# Patient Record
Sex: Female | Born: 1965
Health system: Southern US, Community
[De-identification: ages and names within clinical notes are randomized; demographics above are authoritative.]

## PROBLEM LIST (undated history)

## (undated) DIAGNOSIS — F411 Generalized anxiety disorder: Secondary | ICD-10-CM

## (undated) DIAGNOSIS — E042 Nontoxic multinodular goiter: Secondary | ICD-10-CM

## (undated) DIAGNOSIS — K5792 Diverticulitis of intestine, part unspecified, without perforation or abscess without bleeding: Secondary | ICD-10-CM

## (undated) DIAGNOSIS — E0590 Thyrotoxicosis, unspecified without thyrotoxic crisis or storm: Secondary | ICD-10-CM

## (undated) DIAGNOSIS — C4492 Squamous cell carcinoma of skin, unspecified: Secondary | ICD-10-CM

## (undated) DIAGNOSIS — J302 Other seasonal allergic rhinitis: Secondary | ICD-10-CM

## (undated) DIAGNOSIS — F410 Panic disorder [episodic paroxysmal anxiety] without agoraphobia: Secondary | ICD-10-CM

## (undated) DIAGNOSIS — D271 Benign neoplasm of left ovary: Secondary | ICD-10-CM

## (undated) DIAGNOSIS — R079 Chest pain, unspecified: Secondary | ICD-10-CM

## (undated) DIAGNOSIS — J329 Chronic sinusitis, unspecified: Secondary | ICD-10-CM

## (undated) HISTORY — DX: Thyrotoxicosis, unspecified without thyrotoxic crisis or storm: E05.90

## (undated) HISTORY — PX: AUGMENTATION MAMMAPLASTY: SUR837

## (undated) HISTORY — DX: Generalized anxiety disorder: F41.1

## (undated) HISTORY — DX: Diverticulitis of intestine, part unspecified, without perforation or abscess without bleeding: K57.92

## (undated) HISTORY — DX: Other seasonal allergic rhinitis: J30.2

## (undated) HISTORY — DX: Chronic sinusitis, unspecified: J32.9

## (undated) HISTORY — PX: BREAST EXCISIONAL BIOPSY: SUR124

## (undated) HISTORY — PX: OTHER SURGICAL HISTORY: SHX169

## (undated) HISTORY — DX: Nontoxic multinodular goiter: E04.2

## (undated) HISTORY — DX: Squamous cell carcinoma of skin, unspecified: C44.92

## (undated) HISTORY — DX: Benign neoplasm of left ovary: D27.1

## (undated) HISTORY — DX: Chest pain, unspecified: R07.9

## (undated) HISTORY — PX: TONSILLECTOMY: SUR1361

## (undated) HISTORY — PX: ABDOMINAL HYSTERECTOMY: SHX81

---

## 1986-05-18 HISTORY — PX: BREAST ENHANCEMENT SURGERY: SHX7

## 1994-05-18 HISTORY — PX: BREAST BIOPSY: SHX20

## 1995-05-19 DIAGNOSIS — R079 Chest pain, unspecified: Secondary | ICD-10-CM

## 1995-05-19 HISTORY — PX: OTHER SURGICAL HISTORY: SHX169

## 1995-05-19 HISTORY — DX: Chest pain, unspecified: R07.9

## 1997-01-16 ENCOUNTER — Encounter: Payer: Self-pay | Admitting: Family Medicine

## 1997-01-16 LAB — CONVERTED CEMR LAB
Blood Glucose, Fasting: 90 mg/dL
WBC, blood: 6.5 10*3/uL

## 1998-06-18 HISTORY — PX: OTHER SURGICAL HISTORY: SHX169

## 2004-03-20 ENCOUNTER — Ambulatory Visit: Payer: Self-pay | Admitting: Family Medicine

## 2004-03-28 ENCOUNTER — Emergency Department: Payer: Self-pay | Admitting: Emergency Medicine

## 2005-02-15 ENCOUNTER — Encounter: Payer: Self-pay | Admitting: Family Medicine

## 2005-02-15 LAB — CONVERTED CEMR LAB: Pap Smear: NORMAL

## 2005-04-20 ENCOUNTER — Ambulatory Visit: Payer: Self-pay | Admitting: Family Medicine

## 2005-05-18 DIAGNOSIS — E0590 Thyrotoxicosis, unspecified without thyrotoxic crisis or storm: Secondary | ICD-10-CM

## 2005-05-18 HISTORY — PX: OTHER SURGICAL HISTORY: SHX169

## 2005-05-18 HISTORY — DX: Thyrotoxicosis, unspecified without thyrotoxic crisis or storm: E05.90

## 2005-07-08 ENCOUNTER — Ambulatory Visit: Payer: Self-pay | Admitting: Family Medicine

## 2005-10-22 ENCOUNTER — Ambulatory Visit: Payer: Self-pay | Admitting: Family Medicine

## 2005-10-23 ENCOUNTER — Encounter: Payer: Self-pay | Admitting: Family Medicine

## 2005-10-23 LAB — CONVERTED CEMR LAB: WBC, blood: 6.8 10*3/uL

## 2005-10-29 ENCOUNTER — Ambulatory Visit: Payer: Self-pay | Admitting: Family Medicine

## 2007-03-29 ENCOUNTER — Ambulatory Visit: Payer: Self-pay | Admitting: Family Medicine

## 2007-06-14 ENCOUNTER — Encounter: Payer: Self-pay | Admitting: Family Medicine

## 2007-08-23 ENCOUNTER — Ambulatory Visit: Payer: Self-pay | Admitting: Family Medicine

## 2007-09-01 ENCOUNTER — Encounter: Payer: Self-pay | Admitting: Family Medicine

## 2007-09-01 DIAGNOSIS — R079 Chest pain, unspecified: Secondary | ICD-10-CM | POA: Insufficient documentation

## 2007-09-01 DIAGNOSIS — F411 Generalized anxiety disorder: Secondary | ICD-10-CM | POA: Insufficient documentation

## 2008-03-18 HISTORY — PX: CT ABD W & PELVIS WO CM: HXRAD294

## 2008-03-29 ENCOUNTER — Telehealth: Payer: Self-pay | Admitting: Family Medicine

## 2008-03-29 ENCOUNTER — Ambulatory Visit: Payer: Self-pay | Admitting: Cardiology

## 2008-03-29 ENCOUNTER — Ambulatory Visit: Payer: Self-pay | Admitting: Family Medicine

## 2008-03-29 DIAGNOSIS — K5732 Diverticulitis of large intestine without perforation or abscess without bleeding: Secondary | ICD-10-CM | POA: Insufficient documentation

## 2008-03-29 LAB — CONVERTED CEMR LAB
ALT: 20 units/L (ref 0–35)
Bacteria, UA: 0
Bilirubin Urine: NEGATIVE
CO2: 28 meq/L (ref 19–32)
Chloride: 105 meq/L (ref 96–112)
Eosinophils Absolute: 0 10*3/uL (ref 0.0–0.7)
Eosinophils Relative: 0.2 % (ref 0.0–5.0)
GFR calc non Af Amer: 98 mL/min
Lymphocytes Relative: 8.7 % — ABNORMAL LOW (ref 12.0–46.0)
Monocytes Absolute: 0.8 10*3/uL (ref 0.1–1.0)
Neutrophils Relative %: 83.4 % — ABNORMAL HIGH (ref 43.0–77.0)
Nitrite: NEGATIVE
Platelets: 202 10*3/uL (ref 150–400)
RDW: 12.1 % (ref 11.5–14.6)
Specific Gravity, Urine: <1.005
Urobilinogen, UA: 0.2
WBC: 11 10*3/uL — ABNORMAL HIGH (ref 4.5–10.5)
pH: 6.5

## 2008-04-02 ENCOUNTER — Ambulatory Visit: Payer: Self-pay | Admitting: Family Medicine

## 2008-04-03 ENCOUNTER — Telehealth: Payer: Self-pay | Admitting: Family Medicine

## 2008-04-09 ENCOUNTER — Ambulatory Visit: Payer: Self-pay | Admitting: Family Medicine

## 2008-05-08 ENCOUNTER — Encounter: Payer: Self-pay | Admitting: Family Medicine

## 2009-12-05 ENCOUNTER — Ambulatory Visit: Payer: Self-pay | Admitting: Family Medicine

## 2009-12-05 DIAGNOSIS — IMO0001 Reserved for inherently not codable concepts without codable children: Secondary | ICD-10-CM | POA: Insufficient documentation

## 2009-12-24 ENCOUNTER — Encounter (INDEPENDENT_AMBULATORY_CARE_PROVIDER_SITE_OTHER): Payer: Self-pay | Admitting: *Deleted

## 2010-01-23 ENCOUNTER — Ambulatory Visit: Payer: Self-pay

## 2010-05-23 ENCOUNTER — Ambulatory Visit
Admission: RE | Admit: 2010-05-23 | Discharge: 2010-05-23 | Payer: Self-pay | Source: Home / Self Care | Attending: Family Medicine | Admitting: Family Medicine

## 2010-06-19 NOTE — Assessment & Plan Note (Signed)
Summary: CHEST CONGESTION, PRESSURE IN EARS, COUGH / LFW   Vital Signs:  Patient profile:   45 year old female Weight:      157.75 pounds Temp:     98.1 degrees F oral Pulse rate:   80 / minute Pulse rhythm:   regular BP sitting:   130 / 80  (left arm) Cuff size:   regular  Vitals Entered By: Selena Batten Dance CMA Duncan Dull) (May 23, 2010 3:46 PM) CC: Chest congestion/cough/ears stopped up Comments Given Z-pack, cough medicine and mucinex D on Monday-no help at all   History of Present Illness: CC: chest congestion  2 1/2 wk h/o chest congestion, ear pressure, sinus pressure.  Started as sinus pressure, now seems concentrated in ears and chest.  Still some sinus pressure.  Given Z-pack, on day 4 of course, using guaifenesin and steam along with ibuprofen.  Doesnt feel helping.  + nausea.  iniitally dripping down throat, now better.  cough keeping up at night.  No fevers/chills, abd pain, v/d, rashes, myalgias, arthralgias  no smokers at home.  No h/o asthma.  + h/o allergies.  h/o artificial tear ducts bilaterally.  h/o recurrent sinus infections in past.  severe cough leading to urinary accidents, very embarrassing for her, has had to leave work early this week 2/2 this.  Current Medications (verified): 1)  Aleve 220 Mg Tabs (Naproxen Sodium) .... As Needed 2)  Tylenol 325 Mg Tabs (Acetaminophen) .... As Needed  Allergies: 1)  ! Celebrex  Past History:  Social History: Last updated: 09/01/2007 Marital Status: Married  LIVES WITH HUSBAND Children: 2 CHILDREN Occupation: [employer]  Past Medical History: allergies, seasonal recurrent sinusitis  Review of Systems       per HPI  Physical Exam  General:  Well-developed,well-nourished,in no acute distress; alert,appropriate and cooperative throughout examination.  congested, + cough.  nontoxic Head:  NCAT, + sinus tenderness maxillary Eyes:  PERRLA, EOMI, slight injection Ears:  clear  bilaterally, some fluid behind TMs Nose:  nares clear bilaterally Mouth:  MMM no pharyngeal erythema Neck:  No deformities, masses, or tenderness noted.  no LAD Lungs:  Normal respiratory effort, chest expands symmetrically. Lungs are clear to auscultation, no crackles or wheezes. Heart:  Normal rate and regular rhythm. S1 and S2 normal without gallop, murmur, click, rub or other extra sounds. Pulses:  2+ rad pulses, brisk cap refill Extremities:  no pedal edema Psych:  breaks down when describing stress incontinence sxs with severe cough   Impression & Recommendations:  Problem # 1:  COUGH (ICD-786.2) likely 2/2 sinusitis.  zpack may not be fully covering.  treat with amoxicillin twice daily for 10 days.  tussionex at night and tessalon perls during day to suppress cough.  update early next week if no better, consider broadening to levo or augmentin.   may use nasal saline, neti pot or small amt decongestant.  Complete Medication List: 1)  Aleve 220 Mg Tabs (Naproxen sodium) .... As needed 2)  Tylenol 325 Mg Tabs (Acetaminophen) .... As needed 3)  Amoxicillin 875 Mg Tabs (Amoxicillin) .... Twice daily for 10 days 4)  Tussionex Pennkinetic Er 10-8 Mg/55ml Lqcr (Hydrocod polst-chlorphen polst) .... One by mouth two times a day as needed cough, mainly night 5)  Tessalon Perles 100 Mg Caps (Benzonatate) .... One by mouth three times a day as needed cough  Patient Instructions: 1)  Take medicines as prescribed: amoxicillin and tussionex for cough at night 2)  Take guaifenesin  400mg  IR 1 1/2 pills in am and at noon with plenty of fluid to help mobilize mucous.  tessalon perls during day for cough 3)  Use nasal saline spray or neti pot to help drainage of sinuses. 4)  Push fluids. 5)  If you start having fevers >101.5, trouble swallowing or breathing, or are worsening instead of improving as expected, you may need to be seen again. 6)  Good to see you today, call clinic with questions.    Prescriptions: TESSALON PERLES 100 MG CAPS (BENZONATATE) one by mouth three times a day as needed cough  #30 x 0   Entered and Authorized by:   Eustaquio Boyden  MD   Signed by:   Eustaquio Boyden  MD on 05/23/2010   Method used:   Electronically to        [facility]* (retail)       [address]       [place], [place]  [address]       Ph: [phone]       Fax: [phone]   RxID:   [id number] Sandria Senter ER 10-8 MG/5ML LQCR (HYDROCOD POLST-CHLORPHEN POLST) one by mouth two times a day as needed cough, mainly night  #100cc x 0   Entered and Authorized by:   Eustaquio Boyden  MD   Signed by:   Eustaquio Boyden  MD on 05/23/2010   Method used:   Print then Give to Patient   RxID:   [id number] AMOXICILLIN 875 MG TABS (AMOXICILLIN) twice daily for 10 days  #20 x 0   Entered and Authorized by:   Eustaquio Boyden  MD   Signed by:   Eustaquio Boyden  MD on 05/23/2010   Method used:   Electronically to        [facility]* (retail)       [address]       [place], [place]  [address]       Ph: [phone]       Fax: [phone]   RxID:   [id number]    Orders Added: 1)  Est. Patient Level III [32[address]]    Current Allergies (reviewed today): ! CELEBREX

## 2010-06-19 NOTE — Assessment & Plan Note (Signed)
Summary: NECK,SHOULDER PAIN,NAUSEA/CLE   Vital Signs:  Patient profile:   45 year old female Height:      67 inches Weight:      125.75 pounds BMI:     19.77 Temp:     98.2 degrees F oral Pulse rate:   92 / minute Pulse rhythm:   regular BP sitting:   114 / 84  (right arm) Cuff size:   regular  Vitals Entered By: Karen Rodriguez CMA Karen Rodriguez) (December 05, 2009 3:30 PM) CC: Neck, shoulder pain, some nausea from the pain.  Was on a float being pulled by a boat on Saturday and was slung around some.   History of Present Illness: Tubing this past weekend behind jetski.  No pain Monday or Tuesday, but "I didn't feel like walking with my neighbor like usual on Monday".  Pain yesterday from bra strap up.  Worse today and also with L ear pain.  Back pain is midline and radiates, more to the L than R.  Pain with ROM at neck.  Pain with elevating arms.  No LOC on tube.  No FCV.  Nausea from pain.  Taking aleve and tylenol.  Some help but pain not relieved with meds.  No tingling or loss of sensation in hands, feet.  No bowel/bladder dysfxn.  No other trauma.  Safe at home.   Allergies: 1)  ! Zithromax 2)  ! Celebrex  Review of Systems       See HPI.  Otherwise noncontributory.    Physical Exam  General:  GEN: nad, alert and oriented HEENT: mucous membranes moist, tm wnl bilateral, PERRL, EOMI NECK: not stiff but sore with range of motion. diffusely tender to palpation on posterior aspect.  CV: rrr.  no murmur PULM: ctab, no inc wob ABD: soft, +bs EXT: no edema SKIN: no acute rash, no bruising noted DTRs wnl, strength equal bilaterally, sensation wnl.   Back: upper back with midline pain and diffusely tender to palpation, L>R.     Impression & Recommendations:  Problem # 1:  MUSCLE PAIN (ICD-729.1) Likely related to tubing.  Sedation caution.  Refer to physical therapy if not better.  I see no indication for imaging.  there is no reason to suspect an acute bony process.  I think this is  diffuse soft tissue irritation and muscle spasm.  The following medications were removed from the medication list:    Ibuprofen 200 Mg Caps (Ibuprofen) .Marland Kitchen... As needed pain    Vicodin 5-500 Mg Tabs (Hydrocodone-acetaminophen) ..... One tab by mouth every 6 hrs as needed for pain Her updated medication list for this problem includes:    Aleve 220 Mg Tabs (Naproxen sodium) .Marland Kitchen... As needed    Tylenol 325 Mg Tabs (Acetaminophen) .Marland Kitchen... As needed    Vicodin 5-500 Mg Tabs (Hydrocodone-acetaminophen) .Marland Kitchen... 1-2 tabs by mouth three times a day as needed pain    Flexeril 10 Mg Tabs (Cyclobenzaprine hcl) .Marland Kitchen... 1/2-1 tab by mouth three times a day as needed pain.  Complete Medication List: 1)  Aleve 220 Mg Tabs (Naproxen sodium) .... As needed 2)  Tylenol 325 Mg Tabs (Acetaminophen) .... As needed 3)  Vicodin 5-500 Mg Tabs (Hydrocodone-acetaminophen) .Marland Kitchen.. 1-2 tabs by mouth three times a day as needed pain 4)  Flexeril 10 Mg Tabs (Cyclobenzaprine hcl) .... 1/2-1 tab by mouth three times a day as needed pain.  Patient Instructions: 1)  Take the meds as needed.  Both can make you drowsy.  You can take  a 1/2 pill of either to get started.  Don't take extra tylenol with the vicodin. 2)  Please schedule a follow-up appointment as needed- call back if not better so we can set up physical therapy.  3)  Recommend staying out of work as long as you need potentially sedating pain meds.   Prescriptions: FLEXERIL 10 MG TABS (CYCLOBENZAPRINE HCL) 1/2-1 tab by mouth three times a day as needed pain.  #30 x 0   Entered and Authorized by:   Crawford Givens MD   Signed by:   Crawford Givens MD on 12/05/2009   Method used:   Print then Give to Patient   RxID:   [id number] VICODIN 5-500 MG TABS (HYDROCODONE-ACETAMINOPHEN) 1-2 tabs by mouth three times a day as needed pain  #30 x 0   Entered and Authorized by:   Crawford Givens MD   Signed by:   Crawford Givens MD on 12/05/2009   Method used:   Print then Give to Patient    RxID:   [id number]   Current Allergies (reviewed today): ! ZITHROMAX ! CELEBREX

## 2010-06-19 NOTE — Letter (Signed)
Summary: Karen Rodriguez letter  [facility]  [address] [place], [place] [address]   Phone: [phone]  Fax: [phone]       12/24/2009 MRN: [id number]  [facility] [address] [place], [place]  [address]  Dear Ms. [facility] [place] and [place] announce the retirement of Arta Silence, M.D., from full-time practice at the [facility] office effective November 14, 2009 and his plans of returning part-time.  It is important to Dr. Hetty Ely and to our practice that you understand that [facility] has seven physicians in our office for your health care needs.  We will continue to offer the same exceptional care that you have today.    Dr. Hetty Ely has spoken to many of you about his plans for retirement and returning part-time in the fall.   We will continue to work with you through the transition to schedule appointments for you in the office and meet the high standards that [facility] is committed to.   Again, it is with great pleasure that we share the news that Dr. Hetty Ely will return to [facility] in October of 2011 with a reduced schedule.    If you have any questions, or would like to request an appointment with one of our physicians, please call us at [phone] and press the option for Scheduling an appointment.  We take pleasure in providing you with excellent patient care and look forward to seeing you at your next office visit.  Our [facility] Physicians are:  Tillman Abide, M.D. Laurita Quint, M.D. Roxy Manns, M.D. Kerby Nora, M.D. Hannah Beat, M.D. Ruthe Mannan, M.D. We proudly welcomed Raechel Ache, M.D. and Eustaquio Boyden, M.D. to the practice in July/August 2011.  Sincerely,  [facility]

## 2010-09-16 HISTORY — PX: CT ABD W & PELVIS WO CM: HXRAD294

## 2010-10-15 ENCOUNTER — Telehealth: Payer: Self-pay | Admitting: *Deleted

## 2010-10-15 ENCOUNTER — Ambulatory Visit (INDEPENDENT_AMBULATORY_CARE_PROVIDER_SITE_OTHER): Payer: BC Managed Care – PPO | Admitting: Family Medicine

## 2010-10-15 ENCOUNTER — Encounter: Payer: Self-pay | Admitting: Family Medicine

## 2010-10-15 ENCOUNTER — Encounter: Payer: Self-pay | Admitting: *Deleted

## 2010-10-15 ENCOUNTER — Ambulatory Visit (INDEPENDENT_AMBULATORY_CARE_PROVIDER_SITE_OTHER)
Admission: RE | Admit: 2010-10-15 | Discharge: 2010-10-15 | Disposition: A | Discharge status: Home / Self Care | Payer: BC Managed Care – PPO | Source: Ambulatory Visit | Attending: Internal Medicine | Admitting: Internal Medicine

## 2010-10-15 VITALS — BP 136/80 | HR 84 | Temp 98.3°F | Ht 68.5 in | Wt 152.0 lb

## 2010-10-15 DIAGNOSIS — R1032 Left lower quadrant pain: Secondary | ICD-10-CM

## 2010-10-15 LAB — CBC WITH DIFFERENTIAL/PLATELET
Basophils Absolute: 0.1 10*3/uL (ref 0.0–0.1)
Eosinophils Absolute: 0.1 10*3/uL (ref 0.0–0.7)
HCT: 39.8 % (ref 36.0–46.0)
Hemoglobin: 14 g/dL (ref 12.0–15.0)
Lymphocytes Relative: 13.6 % (ref 12.0–46.0)
Lymphs Abs: 1.4 10*3/uL (ref 0.7–4.0)
MCHC: 35.3 g/dL (ref 30.0–36.0)
Monocytes Relative: 6.4 % (ref 3.0–12.0)
Neutro Abs: 8 10*3/uL — ABNORMAL HIGH (ref 1.4–7.7)
Platelets: 225 10*3/uL (ref 150.0–400.0)
RDW: 13.4 % (ref 11.5–14.6)

## 2010-10-15 LAB — COMPREHENSIVE METABOLIC PANEL
ALT: 16 U/L (ref 0–35)
AST: 18 U/L (ref 0–37)
CO2: 23 mEq/L (ref 19–32)
Calcium: 9.1 mg/dL (ref 8.4–10.5)
Chloride: 105 mEq/L (ref 96–112)
GFR: 94.64 mL/min (ref >60.00)
Sodium: 136 mEq/L (ref 135–145)
Total Protein: 6.8 g/dL (ref 6.0–8.3)

## 2010-10-15 MED ORDER — HYDROCODONE-ACETAMINOPHEN 5-325 MG PO TABS: 1.0000 | ORAL_TABLET | Freq: Four times a day (QID) | ORAL | Status: AC | PRN

## 2010-10-15 MED ORDER — METRONIDAZOLE 500 MG PO TABS: 500.0000 mg | ORAL_TABLET | Freq: Three times a day (TID) | ORAL | Status: AC

## 2010-10-15 MED ORDER — IOHEXOL 300 MG/ML  SOLN
80.0000 mL | Freq: Once | INTRAMUSCULAR | Status: AC | PRN
  Administered 2010-10-15: 80 mL via INTRAVENOUS

## 2010-10-15 MED ORDER — CIPROFLOXACIN HCL 500 MG PO TABS: 500.0000 mg | ORAL_TABLET | Freq: Two times a day (BID) | ORAL | Status: AC

## 2010-10-15 NOTE — Telephone Encounter (Signed)
Please call in vicodin script then notify patient.

## 2010-10-15 NOTE — Telephone Encounter (Signed)
Pt states she just got off the phone with you. She told you she didn't want any pain medicine but she has decided that she does, so that she can sleep.  Uses [facility].

## 2010-10-15 NOTE — Patient Instructions (Signed)
Clear liquid diet starting today. Blood work today. We will set you up with CT scan for today - pass by [facility]'s office. We will obtain results of CT and then likely start antibiotics. Return next week for follow up, sooner if not improving as expected. If any worsening, please go to ER.

## 2010-10-15 NOTE — Progress Notes (Signed)
  Subjective:    Patient ID: Karen Rodriguez, female    DOB: [birth year], 45 y.o.   MRN: [id number]  HPI CC: abd pain  44yo with h/o diverticulitis presents with:  2 d h/o LLQ pain, severe.  Increased BMs but not necessarily diarrhea.  Painful to walk, to change positions.  Constant pain.  Even pain to void.  Flatus more malodorous than usual.  Started metamucil, eating healthier and more fruits/vegetables, fiber, but nauseated after each meal.  Alleve hasn't really helped pain.  Hunching over helps pain but not resolved pain.  + decreased appetite.  No dysuria.  No fevers/chills.  No blood in stool.  No vomiting.    Did have colonoscopy 2009, no records available.  H/o diverticulitis, first and only episode previously 2009 dx by CT scan.  No current RLQ pain.  No h/o abd surgeries.  Medications and allergies reviewed and updated in chart. Pmhx, surghx reviewed and updated in chart  Review of Systems Per HPI    Objective:   Physical Exam  Nursing note and vitals reviewed. Constitutional: She appears well-developed and well-nourished. She appears distressed (in tears from pain).  HENT:  Head: Normocephalic and atraumatic.  Mouth/Throat: Oropharynx is clear and moist. No oropharyngeal exudate.  Eyes: Conjunctivae and EOM are normal. Pupils are equal, round, and reactive to light. No scleral icterus.  Cardiovascular: Normal rate, regular rhythm, normal heart sounds and intact distal pulses.   No murmur heard. Pulmonary/Chest: Effort normal and breath sounds normal. No respiratory distress. She has no wheezes. She has no rales.  Abdominal: Soft. Bowel sounds are normal. She exhibits no distension and no mass. There is tenderness (mainly LLQ tenderness). There is guarding. There is no rebound.       Neg psoas sign. Some voluntary guarding with palpation, tears with palpation at LLQ.  Neurological: She is alert.  Skin: Skin is warm and dry. No rash noted.  Psychiatric: She has a normal  mood and affect.          Assessment & Plan:

## 2010-10-15 NOTE — Assessment & Plan Note (Addendum)
2-3 days duration in setting of known diverticulitis 2009.  Anticipate diverticulitis flare, however significant pain on exam, no fever. Pt states had colonoscopy 2009, told normal.  No records in chart of colonoscpy. rec clear liquid diet, will check blood work stat today and set up with CT of abdomen to r/o other cause of abd pain given severity of pain on exam today. If diverticulitis, treat with abx and close f/u.   Return in 5 days for f/u, sooner if needed. Advised if worsening, or fever, to go to ER for eval. Will have call report done to my cell this afternoon.

## 2010-10-15 NOTE — Telephone Encounter (Signed)
Rx called in as directed and patient notified.  

## 2010-10-21 ENCOUNTER — Encounter: Payer: Self-pay | Admitting: Family Medicine

## 2010-10-21 ENCOUNTER — Ambulatory Visit: Payer: BC Managed Care – PPO | Admitting: Family Medicine

## 2010-10-21 ENCOUNTER — Telehealth: Payer: Self-pay | Admitting: Family Medicine

## 2010-10-21 NOTE — Telephone Encounter (Signed)
Can we call with update on sxs? Can we get copy of colonoscopy from Dr. Servando Snare 2009 [facility]? Thanks.

## 2010-10-22 NOTE — Telephone Encounter (Signed)
Message left advising patient to call with an update.

## 2010-10-22 NOTE — Telephone Encounter (Signed)
Received records from GI - same as in chart.  Never had colonoscopy.  Will need to be set up.

## 2010-10-22 NOTE — Telephone Encounter (Signed)
Records requested

## 2010-10-26 ENCOUNTER — Encounter: Payer: Self-pay | Admitting: Family Medicine

## 2010-10-27 NOTE — Telephone Encounter (Signed)
Pt did not come in for f/u appt.  Please advise she will need to return to GI for further evaluation and likely colonoscopy.  We can set up if she desires.

## 2010-10-27 NOTE — Telephone Encounter (Signed)
Message left notifying patient to follow up with GI for eval and possible colonoscopy. Advised to call with any questions or if she needed [place] to make appt for her.

## 2010-10-27 NOTE — Telephone Encounter (Signed)
Were you going to set this up for her or was that something she needed to do?

## 2011-05-18 ENCOUNTER — Ambulatory Visit: Payer: Self-pay

## 2011-10-20 ENCOUNTER — Ambulatory Visit: Payer: Self-pay | Admitting: Internal Medicine

## 2012-04-08 ENCOUNTER — Encounter: Payer: Self-pay | Admitting: Family Medicine

## 2012-04-08 ENCOUNTER — Ambulatory Visit (INDEPENDENT_AMBULATORY_CARE_PROVIDER_SITE_OTHER): Payer: BC Managed Care – PPO | Admitting: Family Medicine

## 2012-04-08 VITALS — BP 122/74 | HR 78 | Temp 98.0°F | Wt 154.0 lb

## 2012-04-08 DIAGNOSIS — IMO0002 Reserved for concepts with insufficient information to code with codable children: Secondary | ICD-10-CM

## 2012-04-08 DIAGNOSIS — M5410 Radiculopathy, site unspecified: Secondary | ICD-10-CM

## 2012-04-08 MED ORDER — GABAPENTIN 300 MG PO CAPS: 300.0000 mg | ORAL_CAPSULE | Freq: Three times a day (TID) | ORAL | Status: DC

## 2012-04-08 MED ORDER — HYDROCODONE-ACETAMINOPHEN 5-325 MG PO TABS: 1.0000 | ORAL_TABLET | Freq: Four times a day (QID) | ORAL | Status: DC | PRN

## 2012-04-08 NOTE — Patient Instructions (Addendum)
Take 1 gabapentin today, gradually increase to 3 a day if needed. vicodin for pain in meantime, sedation caution.  Take care.  Follow up with Dr. Reece Agar if not improved.

## 2012-04-08 NOTE — Progress Notes (Signed)
Clarified allergies- she does tolerate aleve.    L arm pain.  She went to the gym.  She did a kettle bell class and an ab class this week.  Now with radicular pain from the L shoulder down the L arm and into the dorsum L 3rd and 4th fingers.     No weakness but pain with rom at the wrist and fingers.   No other injury.  No rash. No R sided or any leg sx.    Meds, vitals, and allergies reviewed.   ROS: See HPI.  Otherwise, noncontributory.  nad Uncomfortable Neck with normal ROM, not ttp in midlin L paraspinal muscles in lower C spine tight, tender No rash Normal ROM at the shoulder No dec in sensation or DTRs in the BUE Pain with resisted ROM at the L wrist, hand, fingers Distally NV intact o/w

## 2012-04-09 DIAGNOSIS — M5410 Radiculopathy, site unspecified: Secondary | ICD-10-CM | POA: Insufficient documentation

## 2012-04-09 NOTE — Assessment & Plan Note (Signed)
In ~c8 distribution.  No need to image now.  With spasm.  Use vicodin and increasing gabapentin dose for pain. .she agrees.  D/w pt about anatomy and she understood.  Relative rest in general, would use pendulum exercises for the shoulder.

## 2012-11-15 HISTORY — PX: FINE NEEDLE ASPIRATION: SHX406

## 2013-01-04 ENCOUNTER — Ambulatory Visit (INDEPENDENT_AMBULATORY_CARE_PROVIDER_SITE_OTHER): Payer: BC Managed Care – PPO | Admitting: Family Medicine

## 2013-01-04 ENCOUNTER — Encounter: Payer: Self-pay | Admitting: Family Medicine

## 2013-01-04 VITALS — BP 124/76 | HR 64 | Temp 98.2°F | Wt 155.0 lb

## 2013-01-04 DIAGNOSIS — M7711 Lateral epicondylitis, right elbow: Secondary | ICD-10-CM | POA: Insufficient documentation

## 2013-01-04 DIAGNOSIS — E042 Nontoxic multinodular goiter: Secondary | ICD-10-CM

## 2013-01-04 DIAGNOSIS — E049 Nontoxic goiter, unspecified: Secondary | ICD-10-CM | POA: Insufficient documentation

## 2013-01-04 DIAGNOSIS — M7710 Lateral epicondylitis, unspecified elbow: Secondary | ICD-10-CM

## 2013-01-04 LAB — TSH: TSH: 0.32 u[IU]/mL — ABNORMAL LOW (ref 0.35–5.50)

## 2013-01-04 LAB — T3, FREE: T3, Free: 2.9 pg/mL (ref 2.3–4.2)

## 2013-01-04 MED ORDER — NAPROXEN 500 MG PO TABS: ORAL_TABLET | ORAL | Status: DC

## 2013-01-04 NOTE — Patient Instructions (Signed)
Sign release of records from Dr. Jenne Campus Blood work today to check thyroid. Do stretching exercises. May use naporsyn twice daily with food for 5 days then as needed. Buy tennis elbow brace. If not improving, let us know for referral to Dr. Patsy Lager

## 2013-01-04 NOTE — Progress Notes (Signed)
  Subjective:    Patient ID: Karen Rodriguez, female    DOB: 21-Feb-1966, 47 y.o.   MRN: [id number]  HPI CC: check R elbow  R elbow pain since April 2014.  Started after rowing in pond.  Improved with time but persistent pain especially with heavy lifting.  Pain localized posterior elbow.  Persistent pain.  Taking aleve which helps.  Also used OTC cream. Denies inciting injury/trauma.  Woke up with swelling in neck over summer.  Seen by ENT - s/p FNA told dried blood.  Resolved on its own.  Saw Dr. Jenne Campus. Gaining weight, staying hot, would like thyroid checked.  No fmhx thyroid trouble. H/o thyroid nodules - saw Dr. Patrecia Pace who did biopsy and told normal. Lab Results  Component Value Date   TSH 0.22 10/23/2005    Past Medical History  Diagnosis Date  . Seasonal allergies   . Recurrent sinusitis   . Other specified personal history presenting hazards to health(V15.89)   . Anxiety state, unspecified   . Chest pain, unspecified   . Diverticulitis of colon (without mention of hemorrhage)     dx by CT x 2 (2009, 2012).  never had colonoscopy done.  . Thyrotoxicosis without mention of goiter or other cause, without mention of thyrotoxic crisis or storm     Subclinical MNG  . Enlargement of lymph nodes   . Myalgia and myositis, unspecified     Past Surgical History  Procedure Laterality Date  . Tonsillectomy    . Breast enhancement surgery  1988    Dr. Genevieve Norlander; then revised in [facility] ([place])  . Breast biopsy      Left-benign  . Tear duct surgery  06/1998    Right  . Nsvd      x2  . Cardiolite  1997    normal, negative echo (Dr. Greggory Keen  . Thyroid US      benign cyst - R lobe 3mm, 1.1cm mid post left lobe, 1.2 cm  mid left lobe  . Ct abd w & pelvis wo cm  03/2008    splenomegaly, sigmoid diverticulitis, 2cm dermoid L ovary, constipation  . Ct abd w & pelvis wo cm  09/2010    acute focal uncomplicated upper sigmoid diverticulosis, dermoid cyst L ovary, constipation    Review  of Systems Per HPI    Objective:   Physical Exam  Nursing note and vitals reviewed. Constitutional: She appears well-developed and well-nourished. No distress.  Neck: Neck supple. No thyromegaly present.  Musculoskeletal: She exhibits no edema.  FROM flexion/extension bilateral elbows.  No pain with palpation of elbow landmarks on left elbow. Mildly tender to palpation of R lateral epicondyle but no tenderness to palpation medial epicondyle or olecranon. Tender with forced pronation. Tender with testing forearm flexors       Assessment & Plan:

## 2013-01-04 NOTE — Assessment & Plan Note (Signed)
See pt instructions for plan. Treated with stretching/strengthening exercises from [facility] pt advisor Naprosyn prescription provided today.

## 2013-01-04 NOTE — Assessment & Plan Note (Signed)
Check TFTs today. H/o MNG - consider thyroid US to follow h/o nodules.  (h/o benign biopsy in past). I have requested records from Dr. Jenne Campus to update chart.

## 2013-01-08 ENCOUNTER — Encounter: Payer: Self-pay | Admitting: Family Medicine

## 2013-01-08 ENCOUNTER — Telehealth: Payer: Self-pay | Admitting: Family Medicine

## 2013-01-08 NOTE — Telephone Encounter (Signed)
plz notify I received records from Dr. Jenne Campus and recommend she keep appt with him in November, nothing else needed now. At her next physical I will recommend we obtain thyroid ultrasound to follow h/o thyroid nodules.

## 2013-01-09 NOTE — Telephone Encounter (Signed)
Left message on voice mail  to call back

## 2013-01-11 NOTE — Telephone Encounter (Signed)
Message left notifying patient.

## 2014-05-07 ENCOUNTER — Ambulatory Visit: Payer: Self-pay

## 2014-06-20 ENCOUNTER — Ambulatory Visit (INDEPENDENT_AMBULATORY_CARE_PROVIDER_SITE_OTHER): Payer: BLUE CROSS/BLUE SHIELD | Admitting: General Surgery

## 2014-06-20 ENCOUNTER — Other Ambulatory Visit: Payer: BLUE CROSS/BLUE SHIELD

## 2014-06-20 ENCOUNTER — Encounter: Payer: Self-pay | Admitting: General Surgery

## 2014-06-20 VITALS — BP 118/78 | HR 80 | Resp 14 | Ht 68.5 in | Wt 159.0 lb

## 2014-06-20 DIAGNOSIS — N6001 Solitary cyst of right breast: Secondary | ICD-10-CM

## 2014-06-20 NOTE — Progress Notes (Signed)
Patient ID: Karen Rodriguez, female   DOB: Aug 28, 1965, 49 y.o.   MRN: [id number]  Chief Complaint  Patient presents with  . Other    evaluation of right breast mass- possible cyst    HPI AARICA Rodriguez is a 49 y.o. female who presents for an evaluation of a right breast mass. She states she noticed it in December at the same time she noted bilateral breast tenderness and irritation of the nipple areolar complex. Her most recent mammogram was on 05/07/14. She states she went to [employer] [facility] who had put her on antibiotics because she thought it might be mastitis. She complained of a rash and both breast being hot to the touch. The antibiotics did clear the rash and warmth but she is still having tenderness. She states the area in the right breast has gotten larger.  The patient reports for the preceding 12-18 months her menses had become quite irregular frequently occurring twice per month. For the last 2 cycles she is back to her normal 28 days routine.  The patient is accompanied by her husband of 28-years, Karen Rodriguez. He was present for the interview and exam.  HPI  Past Medical History  Diagnosis Date  . Seasonal allergies   . Recurrent sinusitis   . Anxiety state, unspecified   . Chest pain, unspecified 1997    negative cardiolyte and echo  . Diverticulitis 2009, 2012    dx by CT x 2 (2009, 2012).  never had colonoscopy done.  . Subclinical hyperthyroidism 2007  . Multinodular goiter     Past Surgical History  Procedure Laterality Date  . Tonsillectomy    . Breast enhancement surgery  1988    Dr. Cathie Hoops; then revised in [facility] (Silicone)  . Tear duct surgery  06/1998    Right  . Nsvd      x2  . Cardiolite  1997    normal, negative echo (Dr. Enzo Bi)  . Thyroid US  2007    benign cyst - R lobe 32mm, 1.1cm mid post left lobe, 1.2 cm mid left lobe  . Ct abd w & pelvis wo cm  03/2008    splenomegaly, sigmoid diverticulitis, 2cm dermoid L ovary, constipation  . Ct abd w &  pelvis wo cm  09/2010    acute focal uncomplicated upper sigmoid diverticulosis, dermoid cyst L ovary, constipation  . Thyroid uptake scan  2007    early hyperthyroidism, suggestive early toxic adenomas of left lobe ([place]ati)  . Fine needle aspiration Right 11/2012    thyroid nodule - benign follicular cells consistent with colloid nodule Tami Ribas)  . Breast biopsy Left 1996    bbenign palpable mass excised.    Family History  Problem Relation Age of Onset  . Breast cancer Mother 71  . Hypertension Mother   . Other Father     CCY  . Stroke      GM    Social History History  Substance Use Topics  . Smoking status: Never Smoker   . Smokeless tobacco: Not on file  . Alcohol Use: 0.0 oz/week    0 Not specified per week     Comment: Glass of wine occasionally    Allergies  Allergen Reactions  . Azithromycin     REACTION: UNSPECIFIED  . Celecoxib     REACTION: SWELLING  . Codeine Rash and Other (See Comments)    Upset stomach    Current Outpatient Prescriptions  Medication Sig Dispense Refill  . naproxen sodium (ANAPROX)  220 MG tablet Take 220 mg by mouth as needed.       No current facility-administered medications for this visit.    Review of Systems Review of Systems  Constitutional: Negative.   Respiratory: Negative.   Cardiovascular: Negative.     Blood pressure 118/78, pulse 80, resp. rate 14, height 5' 8.5" (1.74 m), weight 159 lb (72.122 kg), last menstrual period 05/31/2014.  Physical Exam Physical Exam  Constitutional: She is oriented to person, place, and time. She appears well-developed and well-nourished.  Neck: Neck supple. No thyromegaly present.  Cardiovascular: Normal rate, regular rhythm and normal heart sounds.   No murmur heard. Pulmonary/Chest: Effort normal and breath sounds normal. Right breast exhibits mass (3 cm mass at 6 o'clock. ). Right breast exhibits no inverted nipple, no nipple discharge, no skin change and no tenderness. Left  breast exhibits no inverted nipple, no mass, no nipple discharge, no skin change and no tenderness.    Well healed scar lower outer quadrant of left breast.  Lymphadenopathy:    She has no cervical adenopathy.    She has no axillary adenopathy.  Neurological: She is alert and oriented to person, place, and time.  Skin: Skin is warm and dry.    Data Reviewed Bilateral diagnostic mammograms and right breast ultrasound dated May 06 1014 showed dense breasts as well as retropectoral implants. Scattered calcifications bilaterally. Left breast unremarkable. Right breast showed a discrete mass. Reported 3:0.5 x 1.0 cm erythematous skin lesions at the 6, 9 and 10:00 position of the areola.  Ultrasound showed a large cyst at the 10:00 position in the subareolar position measuring 1.3 x 3.9 x 4.4 cm. Multiple smaller cysts were noted. A an intradermal lesion was located at the 9:00 position. Recommendation was for possible infected sebaceous cyst or other dermal lesion. BI-RADS-2.  PCP notes of 06/12/2014 were reviewed.  Ultrasound examination of the right breast was completed due to the patient's reported increasing size of the retroareolar mass. At the 6:00 position 3 cm from the nipple a 1.8 x 1.9 x 3.1 cm multilobulated cystic structure was identified. Multiple adjacent smaller cysts were noted throughout the right breast. Incidental scanning of the left breast showed multiple similar lesions, the largest measuring less than 2 cm and somewhat deeper in the breast parenchyma (no images of the left breast completed) sees.  Because of the patient's report of tenderness it was elected to complete aspiration of the dominant mass in the right breast. This was completed using 1 mL of 1% plain Xylocaine. 8 mL of pale green fluid was obtained with complete resolution of the mass. The procedure was well tolerated.    Assessment    Symptomatic breast cysts likely secondary to fluctuating hormones in  the perimenopausal period.     Plan    The patient can moderate her caffeine consumption if she is able. She has been encouraged to make use of Protegra or Ocuvite vitamins twice a day for the next 3-4 months until her breast tenderness resolves. Should she develop recurrent symptomatic lesion she was encouraged to call directly for assessment.  Routine exams and mammograms will be obtained through her GYN provider.     PCP:  Ria Bush Ref. MD: Fraser Din   Robert Bellow 06/20/2014, 7:58 PM

## 2014-06-20 NOTE — Patient Instructions (Signed)
Patient advised she can try antioxidant vitamins twice daily for 3-4 months. Occuvite or Protegra. Patient to return as needed. The patient is aware to call back for any questions or concerns.

## 2014-07-19 ENCOUNTER — Telehealth: Payer: Self-pay | Admitting: *Deleted

## 2014-07-19 NOTE — Telephone Encounter (Signed)
Pt called and wanted to follow up with you on her BX that she had done in February. She just wanted to make sure everything was okay and that she didn't need to worry about anything.

## 2014-07-19 NOTE — Telephone Encounter (Signed)
Cyst aspiration sample was not sent for cytology. The patient was notified. No recurrence. No symptoms. Follow up as needed.

## 2014-12-18 ENCOUNTER — Ambulatory Visit (INDEPENDENT_AMBULATORY_CARE_PROVIDER_SITE_OTHER): Payer: BLUE CROSS/BLUE SHIELD | Admitting: Family Medicine

## 2014-12-18 ENCOUNTER — Encounter: Payer: Self-pay | Admitting: Family Medicine

## 2014-12-18 VITALS — BP 114/72 | HR 64 | Temp 98.0°F | Wt 160.5 lb

## 2014-12-18 DIAGNOSIS — E049 Nontoxic goiter, unspecified: Secondary | ICD-10-CM | POA: Diagnosis not present

## 2014-12-18 DIAGNOSIS — R05 Cough: Secondary | ICD-10-CM

## 2014-12-18 DIAGNOSIS — M79671 Pain in right foot: Secondary | ICD-10-CM

## 2014-12-18 DIAGNOSIS — R059 Cough, unspecified: Secondary | ICD-10-CM | POA: Insufficient documentation

## 2014-12-18 MED ORDER — OMEPRAZOLE 40 MG PO CPDR: 40.0000 mg | DELAYED_RELEASE_CAPSULE | Freq: Every day | ORAL | Status: DC

## 2014-12-18 NOTE — Assessment & Plan Note (Signed)
Plantar fasciitis vs heel spur. Discussed treatment of plantar fasciitis - PRN NSAID, stretching exercises provided today, water bottle massage, and heel lift. Update if no better for foot xray to eval for heel spur.

## 2014-12-18 NOTE — Assessment & Plan Note (Signed)
Overall normal exam today. Consider thyroid US

## 2014-12-18 NOTE — Progress Notes (Signed)
Pre visit review using our clinic review tool, if applicable. No additional management support is needed unless otherwise documented below in the visit note. 

## 2014-12-18 NOTE — Progress Notes (Signed)
BP 114/72 mmHg  Pulse 64  Temp(Src) 98 F (36.7 C) (Oral)  Wt 160 lb 8 oz (72.802 kg)  LMP 11/25/2014   CC: R foot pain and cough  Subjective:    Patient ID: Karen Rodriguez, female    DOB: [birth year], 49 y.o.   MRN: [id number]  HPI: Karen Rodriguez is a 49 y.o. female presenting on 12/18/2014 for Foot Pain   Aggravating cough present for last several months. Noticed more with talking, eating or drinking. Dry cough and tickling. Didn't start after viral URI. Rare GERD with spicy foods. Some allergic rhinitis today after she mowed lawn last week, not otherwise.   Denies fevers/chills, PNDrainage, headache, ear or tooth pain, ST. No globus sensation.  Tried allergy medication for cough, as well as OTC cough syrup without improvement.    Recently renovated house. Several months of traveling.   R foot pain at longitudinal arch - ongoing for several months as well. Worst pain first few steps of the day, also bad with prolonged walking and prolonged standing. Keeping her from walking. Aggravated with walking on the beach. Tries to wear supportive shoes, sneakers. Has tried aleve which helps some. Rolls bottle on sole.   Recent squamous cell cancer removals x2.   Non smoker.  Relevant past medical, surgical, family and social history reviewed and updated as indicated. Interim medical history since our last visit reviewed. Allergies and medications reviewed and updated. Current Outpatient Prescriptions on File Prior to Visit  Medication Sig  . naproxen sodium (ANAPROX) 220 MG tablet Take 220 mg by mouth as needed.     No current facility-administered medications on file prior to visit.    Review of Systems Per HPI unless specifically indicated above     Objective:    BP 114/72 mmHg  Pulse 64  Temp(Src) 98 F (36.7 C) (Oral)  Wt 160 lb 8 oz (72.802 kg)  LMP 11/25/2014  Wt Readings from Last 3 Encounters:  12/18/14 160 lb 8 oz (72.802 kg)  06/20/14 159 lb (72.122 kg)    01/04/13 155 lb (70.308 kg)    Physical Exam  Constitutional: She appears well-developed and well-nourished. No distress.  HENT:  Head: Normocephalic and atraumatic.  Right Ear: Hearing, tympanic membrane, external ear and ear canal normal.  Left Ear: Hearing, tympanic membrane, external ear and ear canal normal.  Nose: No mucosal edema or rhinorrhea. Right sinus exhibits no maxillary sinus tenderness and no frontal sinus tenderness. Left sinus exhibits no maxillary sinus tenderness and no frontal sinus tenderness.  Mouth/Throat: Uvula is midline and mucous membranes are normal. No oropharyngeal exudate, posterior oropharyngeal edema, posterior oropharyngeal erythema or tonsillar abscesses.  Mild post oropharyngeal erythema  Eyes: Conjunctivae and EOM are normal. Pupils are equal, round, and reactive to light. No scleral icterus.  Neck: Normal range of motion. Neck supple. No thyromegaly present.  No thyroid nodule appreciated today.  Cardiovascular: Normal rate, regular rhythm, normal heart sounds and intact distal pulses.   No murmur heard. Pulmonary/Chest: Effort normal and breath sounds normal. No respiratory distress. She has no wheezes. She has no rales.  Musculoskeletal: She exhibits no edema.  2+ DP bilaterally L foot WNL R foot tender to palpation at long arch and point tender at proximal medial sole  Neg calc squeeze test No ligament laxity No pain at MTs No pain at malleoli  Lymphadenopathy:    She has no cervical adenopathy.  Skin: Skin is warm and dry. No rash noted.  Nursing  note and vitals reviewed.  Results for orders placed or performed in visit on 01/04/13  TSH  Result Value Ref Range   TSH 0.32 (L) 0.35 - 5.50 uIU/mL  T4, Free  Result Value Ref Range   Free T4 1.15 0.60 - 1.60 ng/dL  T3, Free  Result Value Ref Range   T3, Free 2.9 2.3 - 4.2 pg/mL      Assessment & Plan:   Problem List Items Addressed This Visit    Cough - Primary    Not consistent  with infection, doubt post infectious cough.  ?GERD/laryngopharyngeal reflux related cough - treat with omeprazole 40mg  daily x 3 wks, update with effect. Pt declines benzonatate pearls today. No red flags today.      Right foot pain    Plantar fasciitis vs heel spur. Discussed treatment of plantar fasciitis - PRN NSAID, stretching exercises provided today, water bottle massage, and heel lift. Update if no better for foot xray to eval for heel spur.      Thyroid enlargement    Overall normal exam today. Consider thyroid US          Follow up plan: Return if symptoms worsen or fail to improve.

## 2014-12-18 NOTE — Assessment & Plan Note (Addendum)
Not consistent with infection, doubt post infectious cough.  ?GERD/laryngopharyngeal reflux related cough - treat with omeprazole 40mg  daily x 3 wks, update with effect. Pt declines benzonatate pearls today. No red flags today.

## 2014-12-18 NOTE — Patient Instructions (Addendum)
For right foot - likely plantar fasciitis - treat with frozen water bottle massage, heel insert into right shoe, stretching exercises provided today. If no improvement noted in 2-3 weeks, call me for xray of right foot.  For cough - possibly from hidden reflux. Treat with omeprazole 40mg  daily for 3 weeks. Update [place] with effect.  Good to see you today, call us with questions.

## 2015-04-17 ENCOUNTER — Ambulatory Visit (INDEPENDENT_AMBULATORY_CARE_PROVIDER_SITE_OTHER): Payer: BLUE CROSS/BLUE SHIELD | Admitting: Podiatry

## 2015-04-17 ENCOUNTER — Encounter: Payer: Self-pay | Admitting: Podiatry

## 2015-04-17 ENCOUNTER — Ambulatory Visit (INDEPENDENT_AMBULATORY_CARE_PROVIDER_SITE_OTHER): Payer: BLUE CROSS/BLUE SHIELD

## 2015-04-17 VITALS — BP 121/69 | HR 81 | Resp 16

## 2015-04-17 DIAGNOSIS — M722 Plantar fascial fibromatosis: Secondary | ICD-10-CM | POA: Diagnosis not present

## 2015-04-17 MED ORDER — METHYLPREDNISOLONE 4 MG PO TBPK: ORAL_TABLET | ORAL | Status: DC

## 2015-04-17 MED ORDER — DICLOFENAC SODIUM 75 MG PO TBEC: 75.0000 mg | DELAYED_RELEASE_TABLET | Freq: Two times a day (BID) | ORAL | Status: DC

## 2015-04-17 NOTE — Progress Notes (Signed)
   Subjective:    Patient ID: SELEN FESS, female    DOB: 1965/11/04, 49 y.o.   MRN: [id number]  HPI: 49 year old white female presents with chief complaint of right heel pain since February 2016. Mornings are particularly bad and after sitting for a while and getting back up to walk. Primary care provider diagnosed her with plantar fasciitis and recommended physical therapy ice and anti-inflammatories. She states that these did not help.    Review of Systems  Constitutional: Positive for unexpected weight change.  Eyes: Positive for redness.  Musculoskeletal: Positive for gait problem.  All other systems reviewed and are negative.      Objective:   Physical Exam: 49 year old female right foot pain as February. Vital signs stable alert and oriented 3 no acute distress. Pulses are strongly palpable. Neurologic sensorium is intact. Deep tendon reflexes are intact bilaterally muscle strength +5 over 5 dorsiflexion plantar flexors and inverters everters on his musculature is intact. Orthopedic evaluation and strength WAS distal to the ankle for range of motion without crepitation. Pain on palpation medial calcaneal tubercle of the right heel. Radiographic evaluation 3 views in the office today does not demonstrate any type of osseus abnormalities other than soft tissue increase in density at the plantar fascial calcaneal insertion site. Cutaneous evaluation demonstrates supple well-hydrated cutis no erythema edema cellulitis drainage or odor.        Assessment & Plan:  Assessment: Plantar fasciitis right.  Plan: Discussed etiology pathology conservative versus surgical therapies. Injected her right heel today with Kenalog and local anesthetic. Started her on a Medrol Dosepak to be followed by meloxicam. Placed her in a plantar fascial brace and a night splint. Discussed appropriate shoe gear stretching exercises ice therapy issues or modifications. I will follow-up with her in 1 month.

## 2015-04-17 NOTE — Patient Instructions (Signed)

## 2015-04-24 ENCOUNTER — Ambulatory Visit: Payer: BLUE CROSS/BLUE SHIELD | Admitting: Podiatry

## 2015-05-19 DIAGNOSIS — D271 Benign neoplasm of left ovary: Secondary | ICD-10-CM

## 2015-05-19 HISTORY — PX: BREAST CYST ASPIRATION: SHX578

## 2015-05-19 HISTORY — DX: Benign neoplasm of left ovary: D27.1

## 2015-05-22 ENCOUNTER — Ambulatory Visit: Payer: BLUE CROSS/BLUE SHIELD | Admitting: Podiatry

## 2015-06-19 ENCOUNTER — Other Ambulatory Visit: Payer: Self-pay | Admitting: Unknown Physician Specialty

## 2015-06-19 DIAGNOSIS — E041 Nontoxic single thyroid nodule: Secondary | ICD-10-CM

## 2015-06-21 ENCOUNTER — Ambulatory Visit
Admission: RE | Admit: 2015-06-21 | Discharge: 2015-06-21 | Disposition: A | Discharge status: Home / Self Care | Payer: BLUE CROSS/BLUE SHIELD | Source: Ambulatory Visit | Attending: Unknown Physician Specialty | Admitting: Unknown Physician Specialty

## 2015-06-21 DIAGNOSIS — E041 Nontoxic single thyroid nodule: Secondary | ICD-10-CM | POA: Diagnosis not present

## 2015-09-16 ENCOUNTER — Other Ambulatory Visit: Payer: Self-pay | Admitting: Obstetrics and Gynecology

## 2015-09-16 DIAGNOSIS — R87612 Low grade squamous intraepithelial lesion on cytologic smear of cervix (LGSIL): Secondary | ICD-10-CM | POA: Diagnosis not present

## 2015-09-16 DIAGNOSIS — Z124 Encounter for screening for malignant neoplasm of cervix: Secondary | ICD-10-CM | POA: Diagnosis not present

## 2015-09-16 DIAGNOSIS — Z01419 Encounter for gynecological examination (general) (routine) without abnormal findings: Secondary | ICD-10-CM | POA: Diagnosis not present

## 2015-09-16 DIAGNOSIS — N938 Other specified abnormal uterine and vaginal bleeding: Secondary | ICD-10-CM | POA: Diagnosis not present

## 2015-09-16 DIAGNOSIS — Z01411 Encounter for gynecological examination (general) (routine) with abnormal findings: Secondary | ICD-10-CM | POA: Diagnosis not present

## 2015-09-16 DIAGNOSIS — Z1151 Encounter for screening for human papillomavirus (HPV): Secondary | ICD-10-CM | POA: Diagnosis not present

## 2015-09-16 DIAGNOSIS — N63 Unspecified lump in unspecified breast: Secondary | ICD-10-CM

## 2015-09-17 DIAGNOSIS — Z1239 Encounter for other screening for malignant neoplasm of breast: Secondary | ICD-10-CM | POA: Diagnosis not present

## 2015-09-17 DIAGNOSIS — Z1151 Encounter for screening for human papillomavirus (HPV): Secondary | ICD-10-CM | POA: Diagnosis not present

## 2015-09-17 DIAGNOSIS — Z124 Encounter for screening for malignant neoplasm of cervix: Secondary | ICD-10-CM | POA: Diagnosis not present

## 2015-09-17 DIAGNOSIS — Z01411 Encounter for gynecological examination (general) (routine) with abnormal findings: Secondary | ICD-10-CM | POA: Diagnosis not present

## 2015-09-20 ENCOUNTER — Ambulatory Visit
Admission: RE | Admit: 2015-09-20 | Discharge: 2015-09-20 | Disposition: A | Discharge status: Home / Self Care | Payer: BLUE CROSS/BLUE SHIELD | Source: Ambulatory Visit | Attending: Obstetrics and Gynecology | Admitting: Obstetrics and Gynecology

## 2015-09-20 ENCOUNTER — Other Ambulatory Visit: Payer: Self-pay | Admitting: Obstetrics and Gynecology

## 2015-09-20 DIAGNOSIS — N63 Unspecified lump in unspecified breast: Secondary | ICD-10-CM

## 2015-09-25 DIAGNOSIS — E041 Nontoxic single thyroid nodule: Secondary | ICD-10-CM | POA: Diagnosis not present

## 2015-10-03 DIAGNOSIS — N921 Excessive and frequent menstruation with irregular cycle: Secondary | ICD-10-CM | POA: Diagnosis not present

## 2015-10-03 DIAGNOSIS — H524 Presbyopia: Secondary | ICD-10-CM | POA: Diagnosis not present

## 2015-10-03 DIAGNOSIS — D259 Leiomyoma of uterus, unspecified: Secondary | ICD-10-CM | POA: Diagnosis not present

## 2015-10-04 DIAGNOSIS — N8501 Benign endometrial hyperplasia: Secondary | ICD-10-CM | POA: Diagnosis not present

## 2015-10-04 DIAGNOSIS — N921 Excessive and frequent menstruation with irregular cycle: Secondary | ICD-10-CM | POA: Diagnosis not present

## 2015-10-07 ENCOUNTER — Ambulatory Visit (INDEPENDENT_AMBULATORY_CARE_PROVIDER_SITE_OTHER): Payer: BLUE CROSS/BLUE SHIELD | Admitting: General Surgery

## 2015-10-07 ENCOUNTER — Other Ambulatory Visit: Payer: Self-pay

## 2015-10-07 ENCOUNTER — Encounter: Payer: Self-pay | Admitting: General Surgery

## 2015-10-07 VITALS — BP 122/78 | HR 80 | Resp 14 | Ht 68.5 in

## 2015-10-07 DIAGNOSIS — D242 Benign neoplasm of left breast: Secondary | ICD-10-CM | POA: Insufficient documentation

## 2015-10-07 DIAGNOSIS — N6001 Solitary cyst of right breast: Secondary | ICD-10-CM

## 2015-10-07 DIAGNOSIS — N63 Unspecified lump in breast: Secondary | ICD-10-CM | POA: Diagnosis not present

## 2015-10-07 DIAGNOSIS — N6320 Unspecified lump in the left breast, unspecified quadrant: Secondary | ICD-10-CM

## 2015-10-07 NOTE — Progress Notes (Signed)
Patient ID: Karen Rodriguez, female   DOB: Jun 06, 1965, 50 y.o.   MRN: [id number]  Chief Complaint  Patient presents with  . Other    left breast mass    HPI Karen Rodriguez is a 50 y.o. female who presents for a breast evaluation. The most recent mammogram was done on 09/20/15. Patient does perform regular self breast checks and gets regular mammograms done. She reports that a left breast mass was found on her mammogram. She denies feeling a mass on the left. She has a history of right breast cyst. She denies any new breast problems.   The patient was last seen in February 2016 with a symptomatic cyst of the lower outer quadrant of the right breast which resolved on aspiration.  I personally reviewed the patient's history. HPI  Past Medical History  Diagnosis Date  . Seasonal allergies   . Recurrent sinusitis   . Anxiety state, unspecified   . Chest pain, unspecified 1997    negative cardiolyte and echo  . Diverticulitis 2009, 2012    dx by CT x 2 (2009, 2012).  never had colonoscopy done.  . Subclinical hyperthyroidism 2007  . Multinodular goiter   . Squamous cell skin cancer     Past Surgical History  Procedure Laterality Date  . Tonsillectomy    . Breast enhancement surgery  1988    Dr. Cathie Hoops; then revised in [facility] (Silicone)  . Tear duct surgery  06/1998    Right  . Nsvd      x2  . Cardiolite  1997    normal, negative echo (Dr. Enzo Bi)  . Thyroid US  2007    benign cyst - R lobe 32mm, 1.1cm mid post left lobe, 1.2 cm mid left lobe  . Ct abd w & pelvis wo cm  03/2008    splenomegaly, sigmoid diverticulitis, 2cm dermoid L ovary, constipation  . Ct abd w & pelvis wo cm  09/2010    acute focal uncomplicated upper sigmoid diverticulosis, dermoid cyst L ovary, constipation  . Thyroid uptake scan  2007    early hyperthyroidism, suggestive early toxic adenomas of left lobe ([place]ati)  . Fine needle aspiration Right 11/2012    thyroid nodule - benign follicular cells  consistent with colloid nodule Tami Ribas)  . Breast biopsy Left 1996    benign palpable mass excised.  . Augmentation mammaplasty Bilateral     Family History  Problem Relation Age of Onset  . Breast cancer Mother 44  . Hypertension Mother   . Other Father     CCY  . Stroke      GM  . Breast cancer Cousin     Social History Social History  Substance Use Topics  . Smoking status: Never Smoker   . Smokeless tobacco: None  . Alcohol Use: 0.0 oz/week    0 Standard drinks or equivalent per week     Comment: Glass of wine occasionally    Allergies  Allergen Reactions  . Azithromycin     REACTION: UNSPECIFIED  . Celecoxib     REACTION: SWELLING  . Codeine Rash and Other (See Comments)    Upset stomach    No current outpatient prescriptions on file.   No current facility-administered medications for this visit.    Review of Systems Review of Systems  Constitutional: Negative.   Respiratory: Negative.   Cardiovascular: Negative.     Blood pressure 122/78, pulse 80, resp. rate 14, height 5' 8.5" (1.74 m), last menstrual period  10/07/2015.  Physical Exam Physical Exam  Constitutional: She is oriented to person, place, and time. She appears well-developed and well-nourished.  Eyes: Conjunctivae are normal. No scleral icterus.  Neck: Neck supple.  Cardiovascular: Normal rate, regular rhythm and normal heart sounds.   Pulmonary/Chest: Effort normal and breath sounds normal. Right breast exhibits mass. Right breast exhibits no inverted nipple, no nipple discharge, no skin change and no tenderness. Left breast exhibits mass ( left breast mass at 1 o'clock. ). Left breast exhibits no inverted nipple, no nipple discharge, no skin change and no tenderness.    Right breast 3 by 4 mass at 9 o'clock base of nipple. Left breast well healed incision.   Abdominal: Soft. Bowel sounds are normal. There is no tenderness.  Lymphadenopathy:    She has no cervical adenopathy.    She  has no axillary adenopathy.  Neurological: She is alert and oriented to person, place, and time.  Skin: Skin is warm and dry.  Psychiatric: She has a normal mood and affect.  Right breast aspiration: 30 cc murky green fluid       Data Reviewed Bilateral mammograms dated 09/20/2015 were reviewed. Corresponding ultrasound reviewed. 1.5 cm mass 11:00 position of the left breast thought to represent a complex cyst or solid fibroadenoma for which a 6 month follow-up was recommended. BI-RADS 3.  PCP notes of [id number] completed by Ardeth Perfect, PA reviewed.  The patient reports that her husband is quite concerned about the large mass in the right upper outer quadrant of the breast and for that reason ultrasound was offered and accepted.  Multiple large cysts were notable in the upper-outer quadrant. Using 1 mL of 1% plain Xylocaine and an alcohol prep the 3 dominant lesions were aspirated with return of about 30 mL of murky green fluid.  Attention was turned to the left breast in the upper outer quadrant with a palpable thickening is noted. This represents a simple cyst. At the 11:00 position, 2 cm from the nipple a 0.5 x 0.95 x 1.1 cm hypoechoic mass adjacent to the underlying pectoral fascia as noted corresponding to the [facility] study. The patient was amenable to FNA sampling this was completed again using 1 mL of 1% plain Xylocaine. Moderate tenderness with acids of the 22-gauge needle through the lesion was appreciated. Slides 2 were prepared for cytology.    Assessment    Multiple bilateral breast cyst.  Solid nodule left breast, likely fibroadenoma.      Plan    The patient will be contacted with cytology results when available.      PCP: Ria Bush Ref. MD: Fraser Din This has been scribed by Lesly Rubenstein LPN    Robert Bellow 10/07/2015, 8:50 PM

## 2015-10-08 ENCOUNTER — Telehealth: Payer: Self-pay | Admitting: General Surgery

## 2015-10-08 NOTE — Telephone Encounter (Signed)
Notified path benign. 

## 2015-11-12 ENCOUNTER — Ambulatory Visit (INDEPENDENT_AMBULATORY_CARE_PROVIDER_SITE_OTHER): Payer: BLUE CROSS/BLUE SHIELD | Admitting: Obstetrics and Gynecology

## 2015-11-12 ENCOUNTER — Encounter: Payer: Self-pay | Admitting: Obstetrics and Gynecology

## 2015-11-12 VITALS — BP 125/70 | HR 74 | Ht 68.5 in | Wt 155.3 lb

## 2015-11-12 DIAGNOSIS — Z87898 Personal history of other specified conditions: Secondary | ICD-10-CM | POA: Diagnosis not present

## 2015-11-12 DIAGNOSIS — Z803 Family history of malignant neoplasm of breast: Secondary | ICD-10-CM | POA: Diagnosis not present

## 2015-11-12 DIAGNOSIS — N8501 Benign endometrial hyperplasia: Secondary | ICD-10-CM

## 2015-11-12 DIAGNOSIS — N938 Other specified abnormal uterine and vaginal bleeding: Secondary | ICD-10-CM | POA: Diagnosis not present

## 2015-11-12 NOTE — Patient Instructions (Signed)
You are scheduled for surgery on 12/09/2015.  Nothing to eat after midnight on day prior to surgery.  Do not take any medications unless recommended by your provider on day prior to surgery.  Do not take NSAIDs (Motrin, Aleve) or aspirin 7 days prior to surgery.  You may take Tylenol products for minor aches and pains.  You will receive a prescription for pain medications post-operatively.  You will be contacted by phone approximately 1 week prior to surgery to schedule pre-operative appointment.  Please call the office if you have any questions regarding your upcoming surgery.

## 2015-11-14 NOTE — Progress Notes (Signed)
GYNECOLOGY CLINIC PROGRESS NOTE  Subjective:     Karen Rodriguez is a 50 y.o. [phone] woman who presents for 2nd opinion regarding management of irregular bleeding and newly diagnosed complex hyperplasia (without atypia) of the uterus.  Notes that she was seen at [facility] 6-8 weeks ago and underwent workup for irregular heavy menses. Was diagnosed with endometrial hyperplasia (complex without atypia).  Was told that she needed a hysterectomy.   Patient's last menstrual period was 10/30/2015. She is now bleeding every 2-4 weeks, lasting 2-5 days, with medium flow.  Also notes associated moderate dysmenorrhea, relieved withh NSAIDs.  Notes having an episode of DUB several years ago which resolved.  Beginning in 10/2014, patient notes that the irregular bleeding resumed and has been ongoing since then.      Menstrual History: Menarche age: 80  Current contraception: female vasectomy.  Denies h/o STI's. History of abnormal Pap smear: no.  Last pap smear 09/2015 (at [facility]), normal.  Last mammogram: BIRADS-3, probably benign.  Bilateral breast cysts (with largest in left breast, s/p ultrasound guided drainage 10/07/15).   Past Medical History  Diagnosis Date  . Seasonal allergies   . Recurrent sinusitis   . Anxiety state, unspecified   . Chest pain, unspecified 1997    negative cardiolyte and echo  . Diverticulitis 2009, 2012    dx by CT x 2 (2009, 2012).  never had colonoscopy done.  . Subclinical hyperthyroidism 2007  . Multinodular goiter   . Squamous cell skin cancer     Past Surgical History  Procedure Laterality Date  . Tonsillectomy    . Breast enhancement surgery  1988    Dr. Cathie Hoops; then revised in [facility] (Silicone)  . Tear duct surgery  06/1998    Right  . Nsvd      x2  . Cardiolite  1997    normal, negative echo (Dr. Enzo Bi)  . Thyroid US  2007    benign cyst - R lobe 100mm, 1.1cm mid post left lobe, 1.2 cm mid left lobe  . Ct abd w & pelvis wo cm  03/2008   splenomegaly, sigmoid diverticulitis, 2cm dermoid L ovary, constipation  . Ct abd w & pelvis wo cm  09/2010    acute focal uncomplicated upper sigmoid diverticulosis, dermoid cyst L ovary, constipation  . Thyroid uptake scan  2007    early hyperthyroidism, suggestive early toxic adenomas of left lobe (Morayati)  . Fine needle aspiration Right 11/2012    thyroid nodule - benign follicular cells consistent with colloid nodule Tami Ribas)  . Breast biopsy Left 1996    benign palpable mass excised.  . Augmentation mammaplasty Bilateral     Family History  Problem Relation Age of Onset  . Breast cancer Mother 38  . Hypertension Mother   . Other Father     CCY  . Stroke      GM  . Breast cancer Cousin     Social History   Social History  . Marital Status: Married    Spouse Name: N/A  . Number of Children: 2  . Years of Education: N/A   Occupational History  . Glass blower/designer    Social History Main Topics  . Smoking status: Never Smoker   . Smokeless tobacco: Not on file  . Alcohol Use: 0.0 oz/week    0 Standard drinks or equivalent per week     Comment: Glass of wine occasionally  . Drug Use: No  . Sexual Activity: Yes  Birth Control/ Protection: None     Comment: Husband had vasectomcy 1996   Other Topics Concern  . 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   No current facility-administered medications on file prior to visit.    Allergies  Allergen Reactions  . Azithromycin     REACTION: UNSPECIFIED  . Celecoxib     REACTION: SWELLING  . Codeine Rash and Other (See Comments)    Upset stomach     Review of Systems A comprehensive review of systems was negative except for: Ears, nose, mouth, throat, and face: positive for thyroid nodules Genitourinary: positive for abnormal menstrual  periods Integument/breast: positive for breast lump    Objective:    BP 125/70 mmHg  Pulse 74  Ht 5' 8.5" (1.74 m)  Wt 155 lb 4.8 oz (70.444 kg)  BMI 23.27 kg/m2  LMP 10/30/2015  General:   alert and no distress  Skin:    normal and no rash or abnormalities  Neck:  no adenopathy, no carotid bruit, no JVD, supple, symmetrical, trachea midline and thyroid: enlarged, nodular and nontender  Abdomen:  soft, non-tender; bowel sounds normal; no masses,  no organomegaly  Pelvic:   external genitalia normal, rectovaginal septum normal.  Vagina without discharge.  Cervix normal appearing, no lesions and no motion tenderness.  Uterus mobile, nontender, normal shape and size.  Adnexae non-palpable, nontender bilaterally.    Extremities:   Nontender, no edema or cyanosis  Neurologic:  Grossly intact.      Assessment:   Complex endometrial hyperplasia without atypia Dysfunctional uterine bleeding   H/o multiple breast lumps with family h/o breast cancer   Plan:   - Diagnosis explained in detail.  Only partial records received from [place], will request for labs, ultrasound, pap smear, and pathology (endometrial biopsy). Discussed that based on patient's reported diagnosis, her risk of progression to endometrial cancer is ~ 3%. If diagnosis actually does include atypia, progression increases to 35-40% Discussed that without atypia, 2 options for management are available, including medical management with high dose progesterone,  or surgical management with hysterectomy.  Patient notes understanding, and is now ok to proceed to hysterectomy.  Based on today's exam, patient is a candidate for LAVH/TVH. Records reviewed from [facility], still need to obtain biopsy results and ultrasound report. Will request.   - Risks and benefits of ovarian removal discussed including but not limited to: increased risk of bleeding due to additional surgery,  immediate menopause, increased risk of all cause  mortality, mood swings, night sweats, hot flashes, bone loss, loss of cardioprotection all discussed.  Patient still desires removal of ovaries due to family h/o breast cancer.   - The risks of surgery were discussed in detail with the patient including but not limited to: bleeding which may require transfusion or reoperation; infection which may require prolonged hospitalization or re-hospitalization and antibiotic therapy; injury to bowel, bladder, ureters and major vessels or other surrounding organs; need for additional procedures including laparotomy; thromboembolic phenomenon, incisional problems and other postoperative or anesthesia complications.  Patient was told that the likelihood that her condition and symptoms will be treated effectively with this surgical management was very high; the postoperative expectations were also discussed in detail. The patient also understands the alternative treatment  options which were discussed in full. All questions were answered.  She was told that she will be contacted by our surgical scheduler regarding the time and date of her surgery; routine preoperative instructions of having nothing to eat or drink after midnight on the day prior to surgery and also coming to the hospital 1.5 hours prior to her time of surgery were also emphasized.  She was told she may be called for a preoperative appointment about a week prior to surgery and will be given further preoperative instructions at that visit. Printed patient education handouts about the procedure were given to the patient to review at home.  Patient desires to schedule surgery for 12/09/2015, for LAVH with BSO.     A total of 45 minutes were spent face-to-face with the patient during the encounter with greater than 50% dealing with counseling and coordination of care.   Rubie Maid, MD [facility]

## 2015-11-15 DIAGNOSIS — N8501 Benign endometrial hyperplasia: Secondary | ICD-10-CM | POA: Insufficient documentation

## 2015-11-15 DIAGNOSIS — Z803 Family history of malignant neoplasm of breast: Secondary | ICD-10-CM | POA: Insufficient documentation

## 2015-11-15 DIAGNOSIS — Z87898 Personal history of other specified conditions: Secondary | ICD-10-CM | POA: Insufficient documentation

## 2015-11-15 DIAGNOSIS — N938 Other specified abnormal uterine and vaginal bleeding: Secondary | ICD-10-CM | POA: Insufficient documentation

## 2015-11-15 NOTE — H&P (Signed)
Subjective:    Patient is a 50 y.o. [id number] female scheduled for LAVH/BSO. Indications for procedure are abnormal uterine bleeding, endometrial complex hyperplasia without atypia.   Patient's last menstrual period was 10/30/2015. She is now bleeding every 2-4 weeks, lasting 2-5 days, with medium flow. Also notes associated moderate dysmenorrhea, relieved withh NSAIDs. Notes having an episode of DUB several years ago which resolved. Beginning in 10/2014, patient notes that the irregular bleeding resumed and has been ongoing since then.Was worked up at [facility] ~ 6-8 weeks ago, was diagnosed with endometrial hyperplasia (complex without atypia).  Desired second opinion for management options. After counseling of options (medical management with high dose progesterone, or surgical management with hysterectomy), patient desires surgical management.   Pertinent Gynecological History: Menarche age: 70  Current contraception: female vasectomy.  Denies h/o STI's. History of abnormal Pap smear: no. Last pap smear 09/2015 (at [facility]), normal.  Last mammogram: BIRADS-3, probably benign. Bilateral breast cysts (with largest in left breast, s/p ultrasound guided drainage 10/07/15).   Discussed Blood/Blood Products: yes   Menstrual History: Obstetric History   G2   P0   T0   P0   A0   TAB0   SAB0   E0   M0   L2     # Outcome Date GA Lbr Len/2nd Weight Sex Delivery Anes PTL Lv  2 Gravida      Vag-Spont     1 Gravida      Vag-Spont       Obstetric Comments  1st Menstrual Cycle:    1st Pregnancy: 24     Past Medical History  Diagnosis Date  . Seasonal allergies   . Recurrent sinusitis   . Anxiety state, unspecified   . Chest pain, unspecified 1997    negative cardiolyte and echo  . Diverticulitis 2009, 2012    dx by CT x 2 (2009, 2012).  never had colonoscopy done.  . Subclinical hyperthyroidism 2007  . Multinodular goiter   . Squamous cell skin cancer     Past Surgical  History  Procedure Laterality Date  . Tonsillectomy    . Breast enhancement surgery  1988    Dr. Cathie Hoops; then revised in [facility] (Silicone)  . Tear duct surgery  06/1998    Right  . Nsvd      x2  . Cardiolite  1997    normal, negative echo (Dr. Enzo Bi)  . Thyroid US  2007    benign cyst - R lobe 32mm, 1.1cm mid post left lobe, 1.2 cm mid left lobe  . Ct abd w & pelvis wo cm  03/2008    splenomegaly, sigmoid diverticulitis, 2cm dermoid L ovary, constipation  . Ct abd w & pelvis wo cm  09/2010    acute focal uncomplicated upper sigmoid diverticulosis, dermoid cyst L ovary, constipation  . Thyroid uptake scan  2007    early hyperthyroidism, suggestive early toxic adenomas of left lobe (Morayati)  . Fine needle aspiration Right 11/2012    thyroid nodule - benign follicular cells consistent with colloid nodule Tami Ribas)  . Breast biopsy Left 1996    benign palpable mass excised.  . Augmentation mammaplasty Bilateral     Family History  Problem Relation Age of Onset  . Breast cancer Mother 71  . Hypertension Mother   . Other Father     CCY  . Stroke      GM  . Breast cancer Cousin     Social History   Social History  .  Marital Status: Married    Spouse Name: N/A  . Number of Children: 2  . Years of Education: N/A   Occupational History  . Glass blower/designer    Social History Main Topics  . Smoking status: Never Smoker   . Smokeless tobacco: None  . Alcohol Use: 0.0 oz/week    0 Standard drinks or equivalent per week     Comment: Glass of wine occasionally  . Drug Use: No  . Sexual Activity: Yes    Birth Control/ Protection: None     Comment: Husband had vasectomcy 1996   Other Topics Concern  . 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   No facility-administered encounter medications on file as of  11/12/2015.     Allergies  Allergen Reactions  . Azithromycin     REACTION: UNSPECIFIED  . Celecoxib     REACTION: SWELLING  . Codeine Rash and Other (See Comments)    Upset stomach    Review of Systems Constitutional: No recent fever/chills/sweats Respiratory: No recent cough/bronchitis Cardiovascular: No chest pain Gastrointestinal: No recent nausea/vomiting/diarrhea Genitourinary: No UTI symptoms Hematologic/lymphatic:No history of coagulopathy or recent blood thinner use    Objective:    BP 125/70 mmHg  Pulse 74  Ht 5' 8.5" (1.74 m)  Wt 155 lb 4.8 oz (70.444 kg)  BMI 23.27 kg/m2  LMP 10/30/2015  General:   Normal  Skin:   normal  HEENT:  Normal  Neck:  Supple without Adenopathy or Thyromegaly  Lungs:   Heart:              Breasts:   Abdomen:  Pelvis:        M/S:  Extremeties:  clear to auscultation bilaterally   Normal without murmur   Not Examined   soft, non-tender; bowel sounds normal; no masses,  no organomegaly   external genitalia normal, rectovaginal septum normal. Vagina without discharge. Cervix normal appearing, no lesions and no motion tenderness. Uterus mobile, nontender, normal shape and size. Adnexae non-palpable, nontender bilaterally.   No CVAT  Warm/Dry       Neuro: Normal     Assessment:    Complex endometrial hyperplasia without atypia Dysfunctional uterine bleeding   H/o multiple breast lumps with family h/o breast cancer   Plan:    - Diagnosis explained in detail. Only partial records received from [place], will request for labs, ultrasound, pap smear, and pathology (endometrial biopsy). Discussed that based on patient's reported diagnosis, her risk of progression to endometrial cancer is ~ 3%. If diagnosis actually does include atypia, progression increases to 35-40% Discussed that without atypia, 2 options for management are available, including medical management with high dose progesterone, or surgical  management with hysterectomy. Patient notes understanding, and is now ok to proceed to hysterectomy. Based on today's exam, patient is a candidate for LAVH/TVH.  - Risks and benefits of ovarian removal discussed including but not limited to: increased risk of bleeding due to additional surgery, immediate menopause, increased risk of all cause mortality, mood swings, night sweats, hot flashes, bone loss, loss of cardioprotection all discussed. Patient still desires removal of ovaries due to family h/o breast cancer.  - The risks of surgery were discussed in detail with the patient including but not limited to:  bleeding which may require transfusion or reoperation; infection which may require prolonged hospitalization or re-hospitalization and antibiotic therapy; injury to bowel, bladder, ureters and major vessels or other surrounding organs; need for additional procedures including laparotomy; thromboembolic phenomenon, incisional problems and other postoperative or anesthesia complications. Patient was told that the likelihood that her condition and symptoms will be treated effectively with this surgical management was very high; the postoperative expectations were also discussed in detail. The patient also understands the alternative treatment options which were discussed in full. All questions were answered. She was told that she will be contacted by our surgical scheduler regarding the time and date of her surgery; routine preoperative instructions of having nothing to eat or drink after midnight on the day prior to surgery and also coming to the hospital 1.5 hours prior to her time of surgery were also emphasized. She was told she may be called for a preoperative appointment about a week prior to surgery and will be given further preoperative instructions at that visit. Printed patient education handouts about the procedure were given to the patient to review at home. Patient desires to schedule  surgery for 12/09/2015, for LAVH with BSO.     Rubie Maid, MD [facility]

## 2015-11-22 ENCOUNTER — Encounter: Payer: Self-pay | Admitting: Obstetrics and Gynecology

## 2015-11-22 ENCOUNTER — Encounter: Payer: Self-pay | Admitting: Obstetrics & Gynecology

## 2015-11-28 ENCOUNTER — Other Ambulatory Visit: Payer: Self-pay

## 2015-12-03 ENCOUNTER — Ambulatory Visit: Admit: 2015-12-03 | Payer: Self-pay | Admitting: Obstetrics & Gynecology

## 2015-12-03 SURGERY — HYSTERECTOMY, TOTAL, LAPAROSCOPIC
Anesthesia: Choice | Laterality: Bilateral

## 2015-12-19 ENCOUNTER — Encounter: Payer: Self-pay | Admitting: Family Medicine

## 2015-12-19 ENCOUNTER — Ambulatory Visit (INDEPENDENT_AMBULATORY_CARE_PROVIDER_SITE_OTHER)
Admission: RE | Admit: 2015-12-19 | Discharge: 2015-12-19 | Disposition: A | Discharge status: Home / Self Care | Payer: BLUE CROSS/BLUE SHIELD | Source: Ambulatory Visit | Attending: Family Medicine | Admitting: Family Medicine

## 2015-12-19 ENCOUNTER — Ambulatory Visit (INDEPENDENT_AMBULATORY_CARE_PROVIDER_SITE_OTHER): Payer: BLUE CROSS/BLUE SHIELD | Admitting: Family Medicine

## 2015-12-19 VITALS — BP 110/80 | HR 64 | Temp 98.4°F | Wt 156.8 lb

## 2015-12-19 DIAGNOSIS — S29019A Strain of muscle and tendon of unspecified wall of thorax, initial encounter: Secondary | ICD-10-CM

## 2015-12-19 DIAGNOSIS — IMO0002 Reserved for concepts with insufficient information to code with codable children: Secondary | ICD-10-CM | POA: Insufficient documentation

## 2015-12-19 DIAGNOSIS — S2341XA Sprain of ribs, initial encounter: Secondary | ICD-10-CM

## 2015-12-19 DIAGNOSIS — R0781 Pleurodynia: Secondary | ICD-10-CM | POA: Diagnosis not present

## 2015-12-19 MED ORDER — METHOCARBAMOL 500 MG PO TABS: 500.0000 mg | ORAL_TABLET | Freq: Three times a day (TID) | ORAL | 0 refills | Status: DC | PRN

## 2015-12-19 MED ORDER — NAPROXEN 500 MG PO TABS: ORAL_TABLET | ORAL | 0 refills | Status: DC

## 2015-12-19 NOTE — Progress Notes (Signed)
Pre visit review using our clinic review tool, if applicable. No additional management support is needed unless otherwise documented below in the visit note. 

## 2015-12-19 NOTE — Patient Instructions (Addendum)
I think you tore intercostal muscle on the right ribcage. Treat with prescription strength naprosyn and robaxin muscle relaxant at night time - sent to pharmacy Heating pad to area. Carry pillow with you for sudden movements. Should improve over 4-6 weeks. Let us know if not improving as expected. Return for physical.

## 2015-12-19 NOTE — Progress Notes (Signed)
   BP 110/80   Pulse 64   Temp 98.4 F (36.9 C) (Oral)   Wt 156 lb 12 oz (71.1 kg)   LMP 12/17/2015   BMI 23.49 kg/m    CC: R sided pain Subjective:    Patient ID: Karen Rodriguez, female    DOB: [birth year], 50 y.o.   MRN: [id number]  HPI: Karen Rodriguez is a 50 y.o. female presenting on 12/19/2015 for Chest Pain (x 2 weeks; right side; very painful to move and touch)   2 wks ago while leaning over bed rail to kiss husband felt sudden pop associated with dyspnea, since then very tender at anterior ribcage under R breast. Worse pain at night time. Worse pain with lifting, cough, sneeze, laugh. Reproducible, positional.   No fever/chlils, abd pain, nausea/vomiting, diarrhea or constipation.   Aleve dulls pain.   Caring for husband with afib.  Pt with upcoming need for hysterectomy.   Relevant past medical, surgical, family and social history reviewed and updated as indicated. Interim medical history since our last visit reviewed. Allergies and medications reviewed and updated. No current outpatient prescriptions on file prior to visit.   No current facility-administered medications on file prior to visit.     Review of Systems Per HPI unless specifically indicated in ROS section     Objective:    BP 110/80   Pulse 64   Temp 98.4 F (36.9 C) (Oral)   Wt 156 lb 12 oz (71.1 kg)   LMP 12/17/2015   BMI 23.49 kg/m   Wt Readings from Last 3 Encounters:  12/19/15 156 lb 12 oz (71.1 kg)  11/12/15 155 lb 4.8 oz (70.4 kg)  12/18/14 160 lb 8 oz (72.8 kg)    Physical Exam  Constitutional: She appears well-developed and well-nourished. No distress.  Cardiovascular: Normal rate, regular rhythm, normal heart sounds and intact distal pulses.   No murmur heard. Pulmonary/Chest: Effort normal and breath sounds normal. No respiratory distress. She has no wheezes. She has no rales. She exhibits tenderness.    Point tender to right anterior lower ribcage at intercostal muscles    Musculoskeletal: She exhibits no edema.  Skin: Skin is warm and dry. No rash noted.  Psychiatric: She has a normal mood and affect.  Nursing note and vitals reviewed.     Assessment & Plan:   Problem List Items Addressed This Visit    Sprain and strain of ribs - Primary    Exam consistent with intercostal R rib strain. Discussed with patient. Check xray today to r/o rib fx. Treat with naprosyn, robaxin. Discussed anticipated course of resolution. Update if not improving with treatment.       Relevant Orders   DG Ribs Unilateral Right    Other Visit Diagnoses   None.      Follow up plan: Return if symptoms worsen or fail to improve.  Ria Bush, MD

## 2015-12-19 NOTE — Assessment & Plan Note (Signed)
Exam consistent with intercostal R rib strain. Discussed with patient. Check xray today to r/o rib fx. Treat with naprosyn, robaxin. Discussed anticipated course of resolution. Update if not improving with treatment.

## 2015-12-31 ENCOUNTER — Ambulatory Visit (INDEPENDENT_AMBULATORY_CARE_PROVIDER_SITE_OTHER): Payer: BLUE CROSS/BLUE SHIELD | Admitting: Obstetrics and Gynecology

## 2015-12-31 ENCOUNTER — Encounter: Payer: Self-pay | Admitting: Obstetrics and Gynecology

## 2015-12-31 VITALS — BP 114/67 | HR 66 | Ht 68.0 in | Wt 156.3 lb

## 2015-12-31 DIAGNOSIS — Z87898 Personal history of other specified conditions: Secondary | ICD-10-CM

## 2015-12-31 DIAGNOSIS — N924 Excessive bleeding in the premenopausal period: Secondary | ICD-10-CM

## 2015-12-31 DIAGNOSIS — Z803 Family history of malignant neoplasm of breast: Secondary | ICD-10-CM

## 2015-12-31 DIAGNOSIS — N8501 Benign endometrial hyperplasia: Secondary | ICD-10-CM

## 2015-12-31 NOTE — H&P (Signed)
GYNECOLOGY PRE-OPERATIVE HISTORY AND PHYSICAL  Subjective:    Patient is a 50 y.o. [id number] female scheduled for LAVH/BSO. Indications for procedure are abnormal uterine bleeding, endometrial complex hyperplasia without atypia.   Patient's last menstrual period was 10/30/2015. She is now bleeding every 2-4 weeks, lasting 2-5 days, with medium flow. Also notes associated moderate dysmenorrhea, relieved withh NSAIDs. Was worked up at [place] 2-3 months ago,was diagnosed with endometrial hyperplasia (complex without atypia).    Pertinent Gynecological History: Menarche age: 29  Current contraception: female vasectomy.  Denies h/o STI's. History of abnormal Pap smear: no. Last pap smear 09/2015 (at [facility]), normal.  Last mammogram: BIRADS-3, probably benign. Bilateral breast cysts (with largest in left breast, s/p ultrasound guided drainage 10/07/15).   Discussed Blood/Blood Products: yes   Menstrual History: Obstetric History   G2   P0   T0   P0   A0   L2    SAB0   TAB0   Ectopic0   Multiple0   Live Births0     # Outcome Date GA Lbr Len/2nd Weight Sex Delivery Anes PTL Lv  2 Gravida      Vag-Spont     1 Gravida      Vag-Spont       Obstetric Comments  1st Menstrual Cycle:    1st Pregnancy: 24     Past Medical History:  Diagnosis Date  . Anxiety state, unspecified   . Chest pain, unspecified 1997   negative cardiolyte and echo  . Diverticulitis 2009, 2012   dx by CT x 2 (2009, 2012).  never had colonoscopy done.  . Multinodular goiter   . Recurrent sinusitis   . Seasonal allergies   . Squamous cell skin cancer   . Subclinical hyperthyroidism 2007    Past Surgical History:  Procedure Laterality Date  . AUGMENTATION MAMMAPLASTY Bilateral   . BREAST BIOPSY Left 1996   benign palpable mass excised.  [facility] BREAST ENHANCEMENT SURGERY  1988   Dr. Cathie Hoops; then revised in [facility] ([place])  . cardiolite  1997   normal, negative echo (Dr. Enzo Bi)  . CT ABD W  & PELVIS WO CM  03/2008   splenomegaly, sigmoid diverticulitis, 2cm dermoid L ovary, constipation  . CT ABD W & PELVIS WO CM  09/2010   acute focal uncomplicated upper sigmoid diverticulosis, dermoid cyst L ovary, constipation  . FINE NEEDLE ASPIRATION Right 11/2012   thyroid nodule - benign follicular cells consistent with colloid nodule Tami Ribas)  . nsvd     x2  . tear duct surgery  06/1998   Right  . thyroid uptake scan  2007   early hyperthyroidism, suggestive early toxic adenomas of left lobe ([place]ayati)  . thyroid US  2007   benign cyst - R lobe 57mm, 1.1cm mid post left lobe, 1.2 cm mid left lobe  . TONSILLECTOMY      Family History  Problem Relation Age of Onset  . Breast cancer Mother 67  . Hypertension Mother   . Other Father     CCY  . Breast cancer Cousin   . Stroke      GM    Social History   Social History  . Marital status: Married    Spouse name: N/A  . Number of children: 2  . Years of education: N/A   Occupational History  . Glass blower/designer    Social History Main Topics  . Smoking status: Never Smoker  . Smokeless tobacco: Never Used  .  Alcohol use 0.0 oz/week     Comment: Glass of wine occasionally  . Drug use: No  . Sexual activity: Yes    Birth control/ protection: None     Comment: Husband had vasectomcy 1996   Other Topics Concern  . None   Social History Narrative   Married; lives with husband      2 children      Scientist, forensic      Plays softball     Outpatient Encounter Prescriptions as of 12/31/2015  Medication Sig  . Naproxen Sodium (ALEVE PO) Take by mouth.  . [DISCONTINUED] methocarbamol (ROBAXIN) 500 MG tablet Take 1 tablet (500 mg total) by mouth 3 (three) times daily as needed for muscle spasms. (Patient not taking: Reported on 12/30/2015)  . [DISCONTINUED] naproxen (NAPROSYN) 500 MG tablet Take one po bid x 1 week then prn pain, take with food (Patient not taking: Reported on 12/30/2015)  . [DISCONTINUED]  naproxen sodium (ANAPROX) 220 MG tablet Take 220 mg by mouth 2 (two) times daily as needed (pain).   No facility-administered encounter medications on file as of 12/31/2015.      Allergies  Allergen Reactions  . Azithromycin     REACTION: UNSPECIFIED  . Celecoxib     REACTION: SWELLING  . Codeine Rash and Other (See Comments)    Upset stomach    Review of Systems Constitutional: No recent fever/chills/sweats Respiratory: No recent cough/bronchitis Cardiovascular: No chest pain Gastrointestinal: No recent nausea/vomiting/diarrhea Genitourinary: No UTI symptoms Hematologic/lymphatic:No history of coagulopathy or recent blood thinner use    Objective:    BP 114/67   Pulse 66   Ht 5\' 8"  (1.727 m)   Wt 156 lb 4.8 oz (70.9 kg)   LMP 12/17/2015 (Exact Date)   BMI 23.77 kg/m   General:   Normal  Skin:   normal  HEENT:  Normal  Neck:  Supple without Adenopathy or Thyromegaly  Lungs:   Heart:              Breasts:   Abdomen:  Pelvis:        M/S:  Extremeties:  clear to auscultation bilaterally   Normal without murmur   Not Examined   soft, non-tender; bowel sounds normal; no masses,  no organomegaly   external genitalia normal, rectovaginal septum normal. Vagina without discharge. Cervix normal appearing, no lesions and no motion tenderness. Uterus mobile, nontender, normal shape and size. Adnexae non-palpable, nontender bilaterally.   No CVAT  Warm/Dry    Neuro: Normal     Assessment:    Complex endometrial hyperplasia without atypia Dysfunctional uterine bleeding   H/o multiple breast lumps with family h/o breast cancer   Plan:    - Diagnosis explained in detail. Only partial records received from [place], will request for labs, ultrasound, pap smear, and pathology (endometrial biopsy). Discussed that based on patient's reported diagnosis, her risk of progression to endometrial cancer is ~ 3%. If diagnosis actually does include atypia,  progression increases to 35-40% Discussed that without atypia, 2 options for management are available, including medical management with high dose progesterone, or surgical management with hysterectomy. Patient notes understanding, and is now ok to proceed to hysterectomy. Based on today's exam, patient is a candidate for LAVH/BSO.  - Risks and benefits of ovarian removal discussed including but not limited to: increased risk of bleeding due to additional surgery, immediate menopause, increased risk of all cause mortality, mood swings, night sweats, hot flashes, bone loss, loss of  cardioprotection all discussed. Patient still desires removal of ovaries due to family h/o breast cancer.  - The risks of surgery were discussed in detail with the patient including but not limited to: bleeding which may require transfusion or reoperation; infection which may require prolonged hospitalization or re-hospitalization and antibiotic therapy; injury to bowel, bladder, ureters and major vessels or other surrounding organs; need for additional procedures including laparotomy; thromboembolic phenomenon, incisional problems and other postoperative or anesthesia complications. Patient was told that the likelihood that her condition and symptoms will be treated effectively with this surgical management was very high; the postoperative expectations were also discussed in detail. The patient also understands the alternative treatment options which were discussed in full. All questions were answered.   Rubie Maid, MD [facility]

## 2015-12-31 NOTE — Progress Notes (Signed)
    GYNECOLOGY PROGRESS NOTE  Subjective:    Patient ID: Karen Rodriguez, female    DOB: July 22, 1965, 50 y.o.   MRN: [id number]  HPI  Patient is a 50 y.o. G95P0 female who presents for pre-operative exam for scheduled LAVH/BSO for complex hyperplasia (without atypia) of the uterus, and irregular heavy menses. Notes having an episode of bleeding where she soaked through her clothes due to her period despite having a pad and tampon on, approximately 2 weeks ago. Was scheduled to undergo surgery in July, however notes that her husband had to be hospitalized for cardiac issues, so she needed to postpone her surgery.   The following portions of the patient's history were reviewed and updated as appropriate: allergies, current medications, past family history, past medical history, past social history, past surgical history and problem list.  Review of Systems Pertinent items noted in HPI and remainder of comprehensive ROS otherwise negative.   Objective:   Blood pressure 114/67, pulse 66, height 5\' 8"  (1.727 m), weight 156 lb 4.8 oz (70.9 kg), last menstrual period 12/17/2015. General appearance: alert and no distress Abdomen: soft, non-tender; bowel sounds normal; no masses,  no organomegaly Pelvic: external genitalia normal, rectovaginal septum normal.  Vagina without discharge.  Cervix normal appearing, no lesions and no motion tenderness.  Uterus mobile, nontender, normal shape and size.  Some descensus present. Adnexae non-palpable, nontender bilaterally.  Extremities: extremities normal, atraumatic, no cyanosis or edema Neurologic: Grossly normal   Assessment:   Complex endometrial hyperplasia without atypia Dysfunctional uterine bleeding   H/o multiple breast lumps with family h/o breast cancer  Plan:   - The risks of surgery were discussed again in detail with the patient including but not limited to: bleeding which may require transfusion or reoperation; infection which may require  prolonged hospitalization or re-hospitalization and antibiotic therapy; injury to bowel, bladder, ureters and major vessels or other surrounding organs; need for additional procedures including laparotomy; thromboembolic phenomenon, incisional problems and other postoperative or anesthesia complications.  Patient was told that the likelihood that her condition and symptoms will be treated effectively with this surgical management was very high; the postoperative expectations were also discussed in detail. The patient also understands the alternative treatment options which were discussed in full. All questions were answered.  She was told that she will be contacted by our surgical scheduler regarding the time and date of her surgery; routine preoperative instructions of having nothing to eat or drink after midnight on the day prior to surgery and also coming to the hospital 1.5 hours prior to her time of surgery were also emphasized.  Her pre-operative appointment is scheduled for tomorrow.   Patient scheduled for surgery for 8/21//2017, for LAVH with BSO.  - Risks and benefits of ovarian removal discussed including but not limited to: increased risk of bleeding due to additional surgery,  immediate menopause, increased risk of all cause mortality, mood swings, night sweats, hot flashes, bone loss, loss of cardioprotection all discussed.  Patient still desires removal of ovaries due to family h/o breast cancer.    Rubie Maid, MD [facility]

## 2016-01-01 ENCOUNTER — Encounter
Admission: RE | Admit: 2016-01-01 | Discharge: 2016-01-01 | Disposition: A | Discharge status: Home / Self Care | Payer: BLUE CROSS/BLUE SHIELD | Source: Ambulatory Visit | Attending: Obstetrics and Gynecology | Admitting: Obstetrics and Gynecology

## 2016-01-01 DIAGNOSIS — Z01810 Encounter for preprocedural cardiovascular examination: Secondary | ICD-10-CM | POA: Diagnosis not present

## 2016-01-01 DIAGNOSIS — Z01812 Encounter for preprocedural laboratory examination: Secondary | ICD-10-CM | POA: Diagnosis not present

## 2016-01-01 DIAGNOSIS — R079 Chest pain, unspecified: Secondary | ICD-10-CM | POA: Diagnosis not present

## 2016-01-01 HISTORY — DX: Panic disorder (episodic paroxysmal anxiety): F41.0

## 2016-01-01 LAB — CBC
HEMATOCRIT: 40.1 % (ref 35.0–47.0)
Hemoglobin: 14.3 g/dL (ref 12.0–16.0)
MCH: 31.7 pg (ref 26.0–34.0)
MCHC: 35.6 g/dL (ref 32.0–36.0)
MCV: 88.8 fL (ref 80.0–100.0)
Platelets: 217 10*3/uL (ref 150–440)
RBC: 4.51 MIL/uL (ref 3.80–5.20)
RDW: 13.4 % (ref 11.5–14.5)
WBC: 5.1 10*3/uL (ref 3.6–11.0)

## 2016-01-01 LAB — BASIC METABOLIC PANEL
Anion gap: 5 (ref 5–15)
BUN: 15 mg/dL (ref 6–20)
CHLORIDE: 106 mmol/L (ref 101–111)
CO2: 27 mmol/L (ref 22–32)
Calcium: 9.1 mg/dL (ref 8.9–10.3)
Creatinine, Ser: 0.53 mg/dL (ref 0.44–1.00)
GFR calc Af Amer: >60 mL/min (ref >60)
GFR calc non Af Amer: >60 mL/min (ref >60)
GLUCOSE: 89 mg/dL (ref 65–99)
POTASSIUM: 4.2 mmol/L (ref 3.5–5.1)
Sodium: 138 mmol/L (ref 135–145)

## 2016-01-01 LAB — TYPE AND SCREEN
ABO/RH(D): A POS
Antibody Screen: NEGATIVE

## 2016-01-01 NOTE — Patient Instructions (Signed)
  Your procedure is scheduled on:01/05/18 Mon Report to Same Day Surgery 2nd floor medical mall To find out your arrival time please call [phone] between 1PM - 3PM on 01/03/16 Fri  Remember: Instructions that are not followed completely may result in serious medical risk, up to and including death, or upon the discretion of your surgeon and anesthesiologist your surgery may need to be rescheduled.    _x___ 1. Do not eat food or drink liquids after midnight. No gum chewing or hard candies.     __x__ 2. No Alcohol for 24 hours before or after surgery.   __x__3. No Smoking for 24 prior to surgery.   ____  4. Bring all medications with you on the day of surgery if instructed.    __x__ 5. Notify your doctor if there is any change in your medical condition     (cold, fever, infections).     Do not wear jewelry, make-up, hairpins, clips or nail polish.  Do not wear lotions, powders, or perfumes. You may wear deodorant.  Do not shave 48 hours prior to surgery. Men may shave face and neck.  Do not bring valuables to the hospital.    [facility] is not responsible for any belongings or valuables.               Contacts, dentures or bridgework may not be worn into surgery.  Leave your suitcase in the car. After surgery it may be brought to your room.  For patients admitted to the hospital, discharge time is determined by your treatment team.   Patients discharged the day of surgery will not be allowed to drive home.    Please read over the following fact sheets that you were given:   [facility] Preparing for Surgery and or MRSA Information   _x___ Take these medicines the morning of surgery with A SIP OF WATER:    1. None  2.  3.  4.  5.  6.  ____ Fleet Enema (as directed)   _x___ Use CHG Soap or sage wipes as directed on instruction sheet   ____ Use inhalers on the day of surgery and bring to hospital day of surgery  ____ Stop metformin 2 days prior to surgery    ____  Take 1/2 of usual insulin dose the night before surgery and none on the morning of           surgery.   ____ Stop aspirin or coumadin, or plavix  _x__ Stop Anti-inflammatories such as Advil, Aleve, Ibuprofen, Motrin, Naproxen,          Naprosyn, Goodies powders or aspirin products. Ok to take Tylenol.   ____ Stop supplements until after surgery.    ____ Bring C-Pap to the hospital.

## 2016-01-06 ENCOUNTER — Encounter
Admission: RE | Disposition: A | Discharge status: Home / Self Care | Payer: Self-pay | Source: Ambulatory Visit | Attending: Obstetrics and Gynecology

## 2016-01-06 ENCOUNTER — Ambulatory Visit: Payer: BLUE CROSS/BLUE SHIELD | Admitting: Anesthesiology

## 2016-01-06 ENCOUNTER — Observation Stay
Admission: RE | Admit: 2016-01-06 | Discharge: 2016-01-07 | Disposition: A | Discharge status: Home / Self Care | Payer: BLUE CROSS/BLUE SHIELD | Source: Ambulatory Visit | Attending: Obstetrics and Gynecology | Admitting: Obstetrics and Gynecology

## 2016-01-06 DIAGNOSIS — Z978 Presence of other specified devices: Secondary | ICD-10-CM | POA: Diagnosis not present

## 2016-01-06 DIAGNOSIS — Z823 Family history of stroke: Secondary | ICD-10-CM | POA: Insufficient documentation

## 2016-01-06 DIAGNOSIS — N838 Other noninflammatory disorders of ovary, fallopian tube and broad ligament: Secondary | ICD-10-CM | POA: Diagnosis not present

## 2016-01-06 DIAGNOSIS — N920 Excessive and frequent menstruation with regular cycle: Secondary | ICD-10-CM | POA: Diagnosis not present

## 2016-01-06 DIAGNOSIS — F419 Anxiety disorder, unspecified: Secondary | ICD-10-CM | POA: Insufficient documentation

## 2016-01-06 DIAGNOSIS — M1990 Unspecified osteoarthritis, unspecified site: Secondary | ICD-10-CM | POA: Insufficient documentation

## 2016-01-06 DIAGNOSIS — Z85828 Personal history of other malignant neoplasm of skin: Secondary | ICD-10-CM | POA: Insufficient documentation

## 2016-01-06 DIAGNOSIS — N939 Abnormal uterine and vaginal bleeding, unspecified: Secondary | ICD-10-CM | POA: Diagnosis not present

## 2016-01-06 DIAGNOSIS — N8501 Benign endometrial hyperplasia: Secondary | ICD-10-CM | POA: Diagnosis not present

## 2016-01-06 DIAGNOSIS — E0580 Other thyrotoxicosis without thyrotoxic crisis or storm: Secondary | ICD-10-CM | POA: Insufficient documentation

## 2016-01-06 DIAGNOSIS — Z8249 Family history of ischemic heart disease and other diseases of the circulatory system: Secondary | ICD-10-CM | POA: Insufficient documentation

## 2016-01-06 DIAGNOSIS — D250 Submucous leiomyoma of uterus: Secondary | ICD-10-CM | POA: Insufficient documentation

## 2016-01-06 DIAGNOSIS — D271 Benign neoplasm of left ovary: Secondary | ICD-10-CM | POA: Insufficient documentation

## 2016-01-06 DIAGNOSIS — J329 Chronic sinusitis, unspecified: Secondary | ICD-10-CM | POA: Diagnosis not present

## 2016-01-06 DIAGNOSIS — E0590 Thyrotoxicosis, unspecified without thyrotoxic crisis or storm: Secondary | ICD-10-CM | POA: Diagnosis not present

## 2016-01-06 DIAGNOSIS — D251 Intramural leiomyoma of uterus: Secondary | ICD-10-CM | POA: Diagnosis not present

## 2016-01-06 DIAGNOSIS — N938 Other specified abnormal uterine and vaginal bleeding: Secondary | ICD-10-CM | POA: Diagnosis not present

## 2016-01-06 DIAGNOSIS — Z90710 Acquired absence of both cervix and uterus: Secondary | ICD-10-CM | POA: Diagnosis present

## 2016-01-06 DIAGNOSIS — N8500 Endometrial hyperplasia, unspecified: Secondary | ICD-10-CM | POA: Diagnosis not present

## 2016-01-06 DIAGNOSIS — Q504 Embryonic cyst of fallopian tube: Secondary | ICD-10-CM | POA: Diagnosis not present

## 2016-01-06 DIAGNOSIS — Z803 Family history of malignant neoplasm of breast: Secondary | ICD-10-CM | POA: Diagnosis not present

## 2016-01-06 HISTORY — PX: LAPAROSCOPIC VAGINAL HYSTERECTOMY WITH SALPINGO OOPHORECTOMY: SHX6681

## 2016-01-06 LAB — POCT PREGNANCY, URINE: PREG TEST UR: NEGATIVE

## 2016-01-06 SURGERY — HYSTERECTOMY, VAGINAL, LAPAROSCOPY-ASSISTED, WITH SALPINGO-OOPHORECTOMY
Anesthesia: General | Site: Abdomen | Laterality: Bilateral | Wound class: Clean Contaminated

## 2016-01-06 MED ORDER — CEFAZOLIN SODIUM-DEXTROSE 2-4 GM/100ML-% IV SOLN
2.0000 g | INTRAVENOUS | Status: AC
  Administered 2016-01-06: 2 g via INTRAVENOUS

## 2016-01-06 MED ORDER — DEXAMETHASONE SODIUM PHOSPHATE 10 MG/ML IJ SOLN
INTRAMUSCULAR | Status: DC | PRN
  Administered 2016-01-06: 8 mg via INTRAVENOUS

## 2016-01-06 MED ORDER — NAPROXEN 500 MG PO TABS
500.0000 mg | ORAL_TABLET | Freq: Two times a day (BID) | ORAL | Status: DC
  Administered 2016-01-07: 500 mg via ORAL
  Filled 2016-01-06: qty 1

## 2016-01-06 MED ORDER — KETOROLAC TROMETHAMINE 30 MG/ML IJ SOLN
30.0000 mg | Freq: Four times a day (QID) | INTRAMUSCULAR | Status: DC | PRN
  Administered 2016-01-06 – 2016-01-07 (×3): 30 mg via INTRAVENOUS
  Filled 2016-01-06 (×3): qty 1

## 2016-01-06 MED ORDER — ONDANSETRON HCL 4 MG/2ML IJ SOLN
INTRAMUSCULAR | Status: DC | PRN
Start: 2016-01-06 — End: 2016-01-06
  Administered 2016-01-06: 4 mg via INTRAVENOUS

## 2016-01-06 MED ORDER — FENTANYL CITRATE (PF) 100 MCG/2ML IJ SOLN
INTRAMUSCULAR | Status: AC
  Administered 2016-01-06: 25 ug via INTRAVENOUS
  Filled 2016-01-06: qty 2

## 2016-01-06 MED ORDER — ONDANSETRON HCL 4 MG PO TABS
4.0000 mg | ORAL_TABLET | Freq: Four times a day (QID) | ORAL | Status: DC | PRN
  Administered 2016-01-07 (×2): 4 mg via ORAL
  Filled 2016-01-06 (×2): qty 1

## 2016-01-06 MED ORDER — ONDANSETRON HCL 4 MG/2ML IJ SOLN
4.0000 mg | Freq: Four times a day (QID) | INTRAMUSCULAR | Status: DC | PRN
  Administered 2016-01-07: 4 mg via INTRAVENOUS
  Filled 2016-01-06: qty 2

## 2016-01-06 MED ORDER — SIMETHICONE 80 MG PO CHEW
80.0000 mg | CHEWABLE_TABLET | Freq: Four times a day (QID) | ORAL | Status: DC | PRN
  Administered 2016-01-07 (×2): 80 mg via ORAL
  Filled 2016-01-06: qty 1

## 2016-01-06 MED ORDER — ROCURONIUM BROMIDE 100 MG/10ML IV SOLN
INTRAVENOUS | Status: DC | PRN
  Administered 2016-01-06: 30 mg via INTRAVENOUS
  Administered 2016-01-06: 20 mg via INTRAVENOUS

## 2016-01-06 MED ORDER — LACTATED RINGERS IV SOLN: INTRAVENOUS | Status: DC

## 2016-01-06 MED ORDER — LIDOCAINE HCL (CARDIAC) 20 MG/ML IV SOLN
INTRAVENOUS | Status: DC | PRN
  Administered 2016-01-06: 10 mg via INTRAVENOUS
  Administered 2016-01-06: 40 mg via INTRAVENOUS

## 2016-01-06 MED ORDER — FENTANYL CITRATE (PF) 100 MCG/2ML IJ SOLN
INTRAMUSCULAR | Status: DC | PRN
  Administered 2016-01-06 (×2): 50 ug via INTRAVENOUS
  Administered 2016-01-06: 100 ug via INTRAVENOUS
  Administered 2016-01-06 (×4): 50 ug via INTRAVENOUS

## 2016-01-06 MED ORDER — ZOLPIDEM TARTRATE 5 MG PO TABS: 5.0000 mg | ORAL_TABLET | Freq: Every evening | ORAL | Status: DC | PRN

## 2016-01-06 MED ORDER — PANTOPRAZOLE SODIUM 40 MG PO TBEC: 40.0000 mg | DELAYED_RELEASE_TABLET | Freq: Every day | ORAL | Status: DC

## 2016-01-06 MED ORDER — FAMOTIDINE 20 MG PO TABS
ORAL_TABLET | ORAL | Status: AC
  Administered 2016-01-06: 20 mg via ORAL
  Filled 2016-01-06: qty 1

## 2016-01-06 MED ORDER — LABETALOL HCL 5 MG/ML IV SOLN
INTRAVENOUS | Status: DC | PRN
  Administered 2016-01-06: 2.5 mg via INTRAVENOUS

## 2016-01-06 MED ORDER — SUGAMMADEX SODIUM 200 MG/2ML IV SOLN
INTRAVENOUS | Status: DC | PRN
  Administered 2016-01-06: 141.6 mg via INTRAVENOUS

## 2016-01-06 MED ORDER — OXYCODONE-ACETAMINOPHEN 5-325 MG PO TABS
1.0000 | ORAL_TABLET | ORAL | Status: DC | PRN
  Administered 2016-01-07: 1 via ORAL
  Administered 2016-01-07 (×2): 2 via ORAL
  Filled 2016-01-06 (×3): qty 2

## 2016-01-06 MED ORDER — MIDAZOLAM HCL 2 MG/2ML IJ SOLN
INTRAMUSCULAR | Status: DC | PRN
  Administered 2016-01-06 (×2): 1 mg via INTRAVENOUS

## 2016-01-06 MED ORDER — FENTANYL CITRATE (PF) 100 MCG/2ML IJ SOLN
25.0000 ug | INTRAMUSCULAR | Status: DC | PRN
  Administered 2016-01-06 (×2): 25 ug via INTRAVENOUS

## 2016-01-06 MED ORDER — HYDROMORPHONE HCL 1 MG/ML IJ SOLN
0.2000 mg | INTRAMUSCULAR | Status: DC | PRN
  Administered 2016-01-06 – 2016-01-07 (×5): 0.6 mg via INTRAVENOUS
  Filled 2016-01-06 (×5): qty 1

## 2016-01-06 MED ORDER — LACTATED RINGERS IV SOLN
INTRAVENOUS | Status: DC
  Administered 2016-01-06 (×2): via INTRAVENOUS

## 2016-01-06 MED ORDER — MAGNESIUM CITRATE PO SOLN
1.0000 | Freq: Once | ORAL | Status: DC | PRN
  Filled 2016-01-06: qty 296

## 2016-01-06 MED ORDER — SODIUM CHLORIDE 0.9 % IJ SOLN
INTRAMUSCULAR | Status: DC | PRN
  Administered 2016-01-06: 30 mL via INTRAVENOUS

## 2016-01-06 MED ORDER — DOCUSATE SODIUM 100 MG PO CAPS: 100.0000 mg | ORAL_CAPSULE | Freq: Two times a day (BID) | ORAL | Status: DC

## 2016-01-06 MED ORDER — FAMOTIDINE 20 MG PO TABS
20.0000 mg | ORAL_TABLET | Freq: Once | ORAL | Status: AC
  Administered 2016-01-06: 20 mg via ORAL

## 2016-01-06 MED ORDER — ACETAMINOPHEN 10 MG/ML IV SOLN
INTRAVENOUS | Status: DC | PRN
  Administered 2016-01-06: 1000 mg via INTRAVENOUS

## 2016-01-06 MED ORDER — SODIUM CHLORIDE 0.9 % IJ SOLN
INTRAMUSCULAR | Status: AC
Start: 2016-01-06 — End: 2016-01-06
  Filled 2016-01-06: qty 50

## 2016-01-06 MED ORDER — CEFAZOLIN SODIUM-DEXTROSE 2-4 GM/100ML-% IV SOLN
INTRAVENOUS | Status: AC
  Administered 2016-01-06: 2 g via INTRAVENOUS
  Filled 2016-01-06: qty 100

## 2016-01-06 MED ORDER — LACTATED RINGERS IV SOLN
INTRAVENOUS | Status: DC
  Administered 2016-01-06: 20:00:00 via INTRAVENOUS

## 2016-01-06 MED ORDER — VASOPRESSIN 20 UNIT/ML IV SOLN
INTRAVENOUS | Status: AC
  Filled 2016-01-06: qty 1

## 2016-01-06 MED ORDER — BISACODYL 10 MG RE SUPP: 10.0000 mg | Freq: Every day | RECTAL | Status: DC | PRN

## 2016-01-06 MED ORDER — ALUM & MAG HYDROXIDE-SIMETH 200-200-20 MG/5ML PO SUSP: 30.0000 mL | ORAL | Status: DC | PRN

## 2016-01-06 MED ORDER — OXYCODONE HCL 5 MG/5ML PO SOLN
5.0000 mg | Freq: Once | ORAL | Status: DC | PRN
Start: 2016-01-06 — End: 2016-01-06

## 2016-01-06 MED ORDER — MENTHOL 3 MG MT LOZG
1.0000 | LOZENGE | OROMUCOSAL | Status: DC | PRN
  Filled 2016-01-06: qty 9

## 2016-01-06 MED ORDER — PROPOFOL 10 MG/ML IV BOLUS
INTRAVENOUS | Status: DC | PRN
  Administered 2016-01-06: 150 mg via INTRAVENOUS

## 2016-01-06 MED ORDER — OXYCODONE HCL 5 MG PO TABS: 5.0000 mg | ORAL_TABLET | Freq: Once | ORAL | Status: DC | PRN

## 2016-01-06 MED ORDER — VASOPRESSIN 20 UNIT/ML IJ SOLN
INTRAMUSCULAR | Status: DC | PRN
  Administered 2016-01-06: 40 [IU]

## 2016-01-06 MED ORDER — ACETAMINOPHEN 10 MG/ML IV SOLN
INTRAVENOUS | Status: AC
  Filled 2016-01-06: qty 100

## 2016-01-06 SURGICAL SUPPLY — 49 items
BAG URO DRAIN 2000ML W/SPOUT (MISCELLANEOUS) ×2 IMPLANT
BLADE SURG 15 STRL LF DISP TIS (BLADE) ×1 IMPLANT
BLADE SURG 15 STRL SS (BLADE) ×1
BLADE SURG SZ10 CARB STEEL (BLADE) ×2 IMPLANT
BLADE SURG SZ11 CARB STEEL (BLADE) ×2 IMPLANT
CATH FOLEY 2WAY  5CC 16FR (CATHETERS) ×1
CATH URTH 16FR FL 2W BLN LF (CATHETERS) ×1 IMPLANT
CHLORAPREP W/TINT 26ML (MISCELLANEOUS) ×2 IMPLANT
DRAPE SHEET LG 3/4 BI-LAMINATE (DRAPES) ×2 IMPLANT
ELECT REM PT RETURN 9FT ADLT (ELECTROSURGICAL) ×2
ELECTRODE REM PT RTRN 9FT ADLT (ELECTROSURGICAL) ×1 IMPLANT
GAUZE PACK 2X3YD (MISCELLANEOUS) ×2 IMPLANT
GLOVE BIO SURGEON STRL SZ 6 (GLOVE) ×4 IMPLANT
GLOVE BIO SURGEON STRL SZ8 (GLOVE) ×4 IMPLANT
GLOVE BIOGEL PI IND STRL 6.5 (GLOVE) ×1 IMPLANT
GLOVE BIOGEL PI INDICATOR 6.5 (GLOVE) ×1
GLOVE INDICATOR 8.0 STRL GRN (GLOVE) ×4 IMPLANT
GOWN STRL REUS W/ TWL LRG LVL3 (GOWN DISPOSABLE) ×2 IMPLANT
GOWN STRL REUS W/TWL LRG LVL3 (GOWN DISPOSABLE) ×2
HANDLE YANKAUER SUCT BULB TIP (MISCELLANEOUS) ×2 IMPLANT
IRRIGATION STRYKERFLOW (MISCELLANEOUS) ×1 IMPLANT
IRRIGATOR STRYKERFLOW (MISCELLANEOUS) ×2
IV LACTATED RINGERS 1000ML (IV SOLUTION) ×2 IMPLANT
KIT RM TURNOVER CYSTO AR (KITS) ×2 IMPLANT
LABEL OR SOLS (LABEL) ×2 IMPLANT
LIQUID BAND (GAUZE/BANDAGES/DRESSINGS) ×2 IMPLANT
NEEDLE HYPO 22GX1.5 SAFETY (NEEDLE) ×2 IMPLANT
NEEDLE SPNL 22GX3.5 QUINCKE BK (NEEDLE) ×2 IMPLANT
PACK BASIN MINOR ARMC (MISCELLANEOUS) ×2 IMPLANT
PACK GYN LAPAROSCOPIC (MISCELLANEOUS) ×2 IMPLANT
PAD OB MATERNITY 4.3X12.25 (PERSONAL CARE ITEMS) ×2 IMPLANT
SCISSORS METZENBAUM CVD 33 (INSTRUMENTS) ×2 IMPLANT
SHEARS HARMONIC ACE PLUS 36CM (ENDOMECHANICALS) ×2 IMPLANT
SLEEVE ENDOPATH XCEL 5M (ENDOMECHANICALS) ×4 IMPLANT
SPONGE XRAY 4X4 16PLY STRL (MISCELLANEOUS) ×2 IMPLANT
STRIP CLOSURE SKIN 1/2X4 (GAUZE/BANDAGES/DRESSINGS) ×2 IMPLANT
SUT MNCRL 4-0 (SUTURE) ×1
SUT MNCRL 4-0 27XMFL (SUTURE) ×1
SUT VIC AB 0 CT1 27 (SUTURE) ×1
SUT VIC AB 0 CT1 27XCR 8 STRN (SUTURE) ×1 IMPLANT
SUT VIC AB 0 CT1 36 (SUTURE) ×2 IMPLANT
SUT VIC AB 0 CT2 27 (SUTURE) ×2 IMPLANT
SUT VIC AB 2-0 CT1 (SUTURE) ×2 IMPLANT
SUT VIC AB 4-0 FS2 27 (SUTURE) ×2 IMPLANT
SUTURE MNCRL 4-0 27XMF (SUTURE) ×1 IMPLANT
SYR CONTROL 10ML (SYRINGE) ×2 IMPLANT
SYRINGE 10CC LL (SYRINGE) ×2 IMPLANT
TROCAR XCEL NON-BLD 5MMX100MML (ENDOMECHANICALS) ×2 IMPLANT
TUBING INSUFFLATOR HEATED (MISCELLANEOUS) ×2 IMPLANT

## 2016-01-06 NOTE — Transfer of Care (Signed)
Immediate Anesthesia Transfer of Care Note  Patient: Karen Rodriguez  Procedure(s) Performed: Procedure(s): LAPAROSCOPIC ASSISTED VAGINAL HYSTERECTOMY WITH BILATERAL SALPINGO OOPHORECTOMY (Bilateral)  Patient Location: PACU  Anesthesia Type:General  Level of Consciousness: awake, alert  and patient cooperative  Airway & Oxygen Therapy: Patient Spontanous Breathing and Patient connected to face mask oxygen  Post-op Assessment: Report given to RN and Post -op Vital signs reviewed and stable  Post vital signs: Reviewed and stable  Last Vitals:  Vitals:   01/06/16 1145  BP: 138/79  Pulse: 78  Resp: 16  Temp: 36.7 C    Last Pain:  Vitals:   01/06/16 1145  TempSrc: Tympanic         Complications: No apparent anesthesia complications

## 2016-01-06 NOTE — Anesthesia Preprocedure Evaluation (Signed)
Anesthesia Evaluation  Patient identified by MRN, date of birth, ID band Patient awake    Reviewed: Allergy & Precautions, H&P , NPO status , Patient's Chart, lab work & pertinent test results  History of Anesthesia Complications Negative for: history of anesthetic complications  Airway Mallampati: I  TM Distance: >3 FB Neck ROM: full    Dental no notable dental hx. (+) Poor Dentition   Pulmonary neg pulmonary ROS, neg shortness of breath,    Pulmonary exam normal breath sounds clear to auscultation       Cardiovascular Exercise Tolerance: Good (-) angina(-) Past MI and (-) DOE negative cardio ROS Normal cardiovascular exam Rhythm:regular Rate:Normal     Neuro/Psych PSYCHIATRIC DISORDERS Anxiety  Neuromuscular disease    GI/Hepatic negative GI ROS, Neg liver ROS,   Endo/Other  Hyperthyroidism   Renal/GU      Musculoskeletal  (+) Arthritis ,   Abdominal   Peds  Hematology negative hematology ROS (+)   Anesthesia Other Findings Past Medical History: No date: Anxiety state, unspecified 1997: Chest pain, unspecified     Comment: negative cardiolyte and echo 2009, 2012: Diverticulitis     Comment: dx by CT x 2 (2009, 2012).  never had               colonoscopy done. No date: Multinodular goiter No date: Panic attacks No date: Recurrent sinusitis No date: Seasonal allergies No date: Squamous cell skin cancer 2007: Subclinical hyperthyroidism  Past Surgical History: No date: AUGMENTATION MAMMAPLASTY Bilateral 1996: BREAST BIOPSY Left     Comment: benign palpable mass excised. 1988: BREAST ENHANCEMENT SURGERY     Comment: Dr. Cathie Hoops; then revised in [place] ([place]) 0000000: cardiolite     Comment: normal, negative echo (Dr. Enzo Bi) 03/2008: CT ABD [place] CM     Comment: splenomegaly, sigmoid diverticulitis, 2cm               dermoid L ovary, constipation 09/2010: CT ABD W & PELVIS WO CM     Comment:  acute focal uncomplicated upper sigmoid               diverticulosis, dermoid cyst L ovary,               constipation 11/2012: FINE NEEDLE ASPIRATION Right     Comment: thyroid nodule - benign follicular cells               consistent with colloid nodule Tami Ribas) No date: nsvd     Comment: x2 06/1998: tear duct surgery     Comment: Right 2007: thyroid uptake scan     Comment: early hyperthyroidism, suggestive early toxic               adenomas of left lobe ([place]ati) 2007: thyroid US     Comment: benign cyst - R lobe 72mm, 1.1cm mid post left               lobe, 1.2 cm mid left lobe No date: TONSILLECTOMY  BMI    Body Mass Index:  23.37 kg/m      Reproductive/Obstetrics negative OB ROS                             Anesthesia Physical Anesthesia Plan  ASA: III  Anesthesia Plan: General ETT   Post-op Pain Management:    Induction:   Airway Management Planned:   Additional Equipment:   Intra-op Plan:  Post-operative Plan:   Informed Consent: I have reviewed the patients History and Physical, chart, labs and discussed the procedure including the risks, benefits and alternatives for the proposed anesthesia with the patient or authorized representative who has indicated his/her understanding and acceptance.     Plan Discussed with: Anesthesiologist, CRNA and Surgeon  Anesthesia Plan Comments:         Anesthesia Quick Evaluation

## 2016-01-06 NOTE — Anesthesia Procedure Notes (Signed)
Procedure Name: Intubation Date/Time: 01/06/2016 4:03 PM Performed by: Allean Found Pre-anesthesia Checklist: Emergency Drugs available, Patient identified, Suction available, Patient being monitored and Timeout performed Patient Re-evaluated:Patient Re-evaluated prior to inductionOxygen Delivery Method: Circle system utilized Preoxygenation: Pre-oxygenation with 100% oxygen Intubation Type: IV induction Ventilation: Mask ventilation without difficulty Laryngoscope Size: Mac and 3 Grade View: Grade I Tube type: Oral Number of attempts: 1 Airway Equipment and Method: Stylet Secured at: 21 cm Tube secured with: Tape Dental Injury: Teeth and Oropharynx as per pre-operative assessment

## 2016-01-06 NOTE — H&P (Signed)
UPDATE TO PREVIOUS HISTORY AND PHYSICAL  The patient has been seen and examined.  H&P is up to date, no changes noted. Patient is a 50 y.o. G48P2002 female scheduled for LAVH/BSO. Indications for procedure are abnormal uterine bleeding, endometrial complex hyperplasia without atypia. All questions answered. Patient can proceed to the OR for scheduled procedure.    Rubie Maid, MD 01/06/2016 3:16 PM

## 2016-01-06 NOTE — Op Note (Signed)
Procedure(s): LAPAROSCOPIC ASSISTED VAGINAL HYSTERECTOMY WITH BILATERAL SALPINGO OOPHORECTOMY Procedure Note  Karen Rodriguez female 50 y.o. 01/06/2016  Indications: The patient is a 50 y.o. G15P2002 female with complex hyperplasia without atypia, dysfunctional uterine bleeding.  Pre-operative Diagnosis:  Complex hyperplasia without atypia, dysfunctional uterine bleeding  Post-operative Diagnosis: Same  Surgeon: Rubie Maid, MD  Assistants: Malachi Paradise, MD  Anesthesia: General endotracheal anesthesia  Findings: The uterus was sounded to 8 cm Fallopian tubes and ovaries appeared normal.   Procedure Details: The patient was seen in the Holding Room. The risks, benefits, complications, treatment options, and expected outcomes were discussed with the patient.  The patient concurred with the proposed plan, giving informed consent.  The site of surgery properly noted/marked. The patient was taken to the Operating Room, identified as Karen Rodriguez and the procedure verified as Procedure(s) (LRB): LAPAROSCOPIC ASSISTED VAGINAL HYSTERECTOMY WITH BILATERAL SALPINGO OOPHORECTOMY (Bilateral).   She was then placed under general anesthesia without difficulty. She was placed in the dorsal lithotomy position, and was prepped and draped in a sterile manner. After an adequate timeout was performed, a foley catheter was placed.  A sterile speculum was inserted into the vagina and the cervix was grasped at the anterior lip using a single-toothed tenaculum.  The uterus was sounded to 8 cm, and a Hulka clamp was placed for uterine manipulation.  The speculum and tenaculum were then removed.   Attention was turned to the abdomen where an umbilical incision was made with the scalpel.  A 5 -mm Optiview port was placed under direct visualization.  After entrance into the abdominal cavity was confirmed, the abdomen was insufflated with carbon dioxide gas and adequate pneumoperitoneum was obtained.   Bilateral 5-mm lower quadrant ports were then placed under direct visualization.  A survey of the patient's pelvis and abdomen revealed the findings as above.  The ureters were identified bilaterally, with peristalsis noted. Attention was turned to the infundibulopelvic ligament on the patient's right side, which was clamped using the Harmonic scalpel and ligated. The broad ligament was also ligated with the Harmonic scalpel working towards the round ligament.  The round and broad ligaments were then clamped and transected with the Harmonic scalpel.  The ureter were noted to be safely away from the area of dissection.  The uterine artery was then skeletonized and a bladder flap was created.  The bladder was then bluntly dissected off the lower uterine segment.  At this point, attention was turned to the right uterine vessels, which were clamped and ligated using the Harmonic scalpel.  Attention was then turned to the patient's left side, which was treated in a similar manner by taking the infundibulopelvic ligament, the round ligament and the broad ligaments and the bladder flap creation was completed. The left uterine vessels were also clamped and transected in a similar fashion. The decision was made to leave the trocars in place and proceed with completing the hysterectomy via the vaginal route .  Attention was then turned to her pelvis.  A weighted speculum was then placed in the vagina, and the anterior and posterior lips of the cervix were grasped bilaterally with tenaculums.  The cervix was then injected circumferentially with Vasopressin 10 units in 30 ml of saline solution to maintain hemostasis.  The cervix was then circumferentially incised, and the posterior cul-de-sac was entered sharply without difficulty.  A long weighted speculum was inserted into the posterior cul-de-sac. The Heaney clamp was then used to clamp the uterosacral ligaments on either  side.  They were then cut and sutured ligated with  0-Vicryl, and were held with a tag for later identification. Of note, all sutures used in this case were 0-Vicryl unless otherwise noted.   The cardinal ligaments were then clamped, cut and ligated bilaterally.  At this point, full entry into the anterior cul-de-sac was made, no injury to the bladder was noted.  The uterus, tubes and ovaries were freed from all ligaments and was then delivered and sent to pathology.  The vaginal cuff was then closed using figure-of-eight sutures  Using 0-Vicryl with care given to incorporate the uterosacral pedicles bilaterally.  All instruments were then removed from the pelvis.   Attention was then returned to her abdomen which was insufflated again with carbon dioxide gas.  The laparoscope was used to survey the operative site.   No intraoperative injury to other surrounding organs was noted.  The abdomen was desufflated and all instruments were then removed from the patient's abdomen.   All skin incisions were closed an interrupted suture of 4-0 Monocryl for hemostasis, and covered with Liquiband.   The patient tolerated the procedure well.  All instruments, needles, and sponge counts were correct x 2. The patient was taken to the recovery room awake, extubated and in stable condition.    Estimated Blood Loss:  400 ml      Drains: foley catheter to gravity, with 300 ml of clear urine at the end of the procedure.         Total IV Fluids: 1400 ml of Lactated Ringer's  Specimens: Uterus with cervix, bilateral fallopian tubes and ovaries.         Implants: None         Complications:  None; patient tolerated the procedure well.         Disposition: PACU - hemodynamically stable.         Condition: stable   Rubie Maid, MD [facility]

## 2016-01-07 DIAGNOSIS — D251 Intramural leiomyoma of uterus: Secondary | ICD-10-CM | POA: Diagnosis not present

## 2016-01-07 DIAGNOSIS — N8500 Endometrial hyperplasia, unspecified: Secondary | ICD-10-CM | POA: Diagnosis not present

## 2016-01-07 DIAGNOSIS — D271 Benign neoplasm of left ovary: Secondary | ICD-10-CM | POA: Diagnosis not present

## 2016-01-07 DIAGNOSIS — Z8249 Family history of ischemic heart disease and other diseases of the circulatory system: Secondary | ICD-10-CM | POA: Diagnosis not present

## 2016-01-07 DIAGNOSIS — Z823 Family history of stroke: Secondary | ICD-10-CM | POA: Diagnosis not present

## 2016-01-07 DIAGNOSIS — D250 Submucous leiomyoma of uterus: Secondary | ICD-10-CM | POA: Diagnosis not present

## 2016-01-07 DIAGNOSIS — M1990 Unspecified osteoarthritis, unspecified site: Secondary | ICD-10-CM | POA: Diagnosis not present

## 2016-01-07 DIAGNOSIS — N838 Other noninflammatory disorders of ovary, fallopian tube and broad ligament: Secondary | ICD-10-CM | POA: Diagnosis not present

## 2016-01-07 DIAGNOSIS — Z803 Family history of malignant neoplasm of breast: Secondary | ICD-10-CM | POA: Diagnosis not present

## 2016-01-07 LAB — CBC
HEMATOCRIT: 34.9 % — AB (ref 35.0–47.0)
HEMOGLOBIN: 12.7 g/dL (ref 12.0–16.0)
MCH: 32 pg (ref 26.0–34.0)
MCHC: 36.4 g/dL — AB (ref 32.0–36.0)
MCV: 88 fL (ref 80.0–100.0)
Platelets: 193 10*3/uL (ref 150–440)
RBC: 3.96 MIL/uL (ref 3.80–5.20)
RDW: 13.5 % (ref 11.5–14.5)
WBC: 10.3 10*3/uL (ref 3.6–11.0)

## 2016-01-07 LAB — CREATININE, SERUM
Creatinine, Ser: 0.5 mg/dL (ref 0.44–1.00)
GFR calc Af Amer: >60 mL/min (ref >60)
GFR calc non Af Amer: >60 mL/min (ref >60)

## 2016-01-07 MED ORDER — DOCUSATE SODIUM 100 MG PO CAPS: 100.0000 mg | ORAL_CAPSULE | Freq: Two times a day (BID) | ORAL | 2 refills | Status: DC | PRN

## 2016-01-07 MED ORDER — NAPROXEN 500 MG PO TABS: 500.0000 mg | ORAL_TABLET | Freq: Two times a day (BID) | ORAL | 1 refills | Status: DC

## 2016-01-07 MED ORDER — OXYCODONE-ACETAMINOPHEN 5-325 MG PO TABS: 1.0000 | ORAL_TABLET | Freq: Four times a day (QID) | ORAL | 0 refills | Status: DC | PRN

## 2016-01-07 MED ORDER — SIMETHICONE 80 MG PO CHEW: 80.0000 mg | CHEWABLE_TABLET | Freq: Four times a day (QID) | ORAL | 0 refills | Status: DC | PRN

## 2016-01-07 NOTE — Discharge Instructions (Signed)
General Gynecological Post-Operative Instructions °You may expect to feel dizzy, weak, and drowsy for as long as 24 hours after receiving the medicine that made you sleep (anesthetic).  °Do not drive a car, ride a bicycle, participate in physical activities, or take public transportation until you are done taking narcotic pain medicines or as directed by your doctor.  °Do not drink alcohol or take tranquilizers.  °Do not take medicine that has not been prescribed by your doctor.  °Do not sign important papers or make important decisions while on narcotic pain medicines.  °Have a responsible person with you.  °CARE OF INCISION  °Keep incision clean and dry. °Take showers instead of baths until your doctor gives you permission to take baths.  °Avoid heavy lifting (more than 10 pounds/4.5 kilograms), pushing, or pulling.  °Avoid activities that may risk injury to your surgical site.  °No sexual intercourse or placement of anything in the vagina for 6 weeks or as instructed by your doctor. °If you have tubes coming from the wound site, check with your doctor regarding appropriate care of the tubes. °Only take prescription or over-the-counter medicines  for pain, discomfort, or fever as directed by your doctor. Do not take aspirin. It can make you bleed. Take medicines (antibiotics) that kill germs if they are prescribed for you.  °Call the office or go to the Emergency Room if:  °You feel sick to your stomach (nauseous).  °You start to throw up (vomit).  °You have trouble eating or drinking.  °You have an oral temperature above 101.  °You have constipation that is not helped by adjusting diet or increasing fluid intake. Pain medicines are a common cause of constipation.  °You have any other concerns. °SEEK IMMEDIATE MEDICAL CARE IF:  °You have persistent dizziness.  °You have difficulty breathing or a congested sounding (croupy) cough.  °You have an oral temperature above 102.5, not controlled by medicine.  °There is  increasing pain or tenderness near or in the surgical site.  ° ° °

## 2016-01-07 NOTE — Discharge Summary (Signed)
Gynecology Physician Postoperative Discharge Summary  Patient ID: Karen Rodriguez MRN: [id number] DOB/AGE: 1965-12-28 50 y.o.  Admit Date: 01/06/2016 Discharge Date: 01/07/2016  Preoperative Diagnoses: Complex hyperplasia without atypia, abnormal uterine bleeding  Procedures: Procedure(s) (LRB): LAPAROSCOPIC ASSISTED VAGINAL HYSTERECTOMY WITH BILATERAL SALPINGO OOPHORECTOMY (Bilateral)  Hospital Course:  Karen Rodriguez is a 50 y.o. G2P0  admitted for scheduled surgery.  She underwent the procedures as mentioned above, her operation was uncomplicated. For further details about surgery, please refer to the operative report. Patient had an uncomplicated postoperative course. By time of discharge on POD#1, her pain was controlled on oral pain medications; she was ambulating, voiding without difficulty, tolerating regular diet and passing flatus. She was deemed stable for discharge to home.   Significant Labs: CBC Latest Ref Rng & Units 01/07/2016 01/01/2016 10/15/2010  WBC 3.6 - 11.0 K/uL 10.3 5.1 10.1  Hemoglobin 12.0 - 16.0 g/dL 12.7 14.3 14.0  Hematocrit 35.0 - 47.0 % 34.9(L) 40.1 39.8  Platelets 150 - 440 K/uL 193 217 225.0    Discharge Exam: Blood pressure 130/72, pulse 91, temperature 98.4 F (36.9 C), temperature source Oral, resp. rate 18, height 5' 8.5" (1.74 m), weight 156 lb (70.8 kg), last menstrual period 12/17/2015, SpO2 98 %. General appearance: alert and no distress  Resp: clear to auscultation bilaterally  Cardio: regular rate and rhythm  GI: soft, non-tender; bowel sounds normal; no masses, no organomegaly.  Incision: C/D/I, no erythema, no drainage noted Pelvic: scant blood on pad  Extremities: extremities normal, atraumatic, no cyanosis or edema and Homans sign is negative, no sign of DVT  Discharged Condition: Stable  Disposition: 01-Home or Self Care     Medication List    STOP taking these medications   ALEVE PO     TAKE these medications   docusate  sodium 100 MG capsule Commonly known as:  COLACE Take 1 capsule (100 mg total) by mouth 2 (two) times daily as needed for mild constipation.   naproxen 500 MG tablet Commonly known as:  NAPROSYN Take 1 tablet (500 mg total) by mouth 2 (two) times daily with a meal.   oxyCODONE-acetaminophen 5-325 MG tablet Commonly known as:  PERCOCET/ROXICET Take 1-2 tablets by mouth every 6 (six) hours as needed for severe pain (moderate to severe pain (when tolerating fluids)).   simethicone 80 MG chewable tablet Commonly known as:  MYLICON Chew 1 tablet (80 mg total) by mouth 4 (four) times daily as needed for flatulence.        Signed:  Rubie Maid, MD [facility]

## 2016-01-07 NOTE — Anesthesia Postprocedure Evaluation (Signed)
Anesthesia Post Note  Patient: Karen Rodriguez  Procedure(s) Performed: Procedure(s) (LRB): LAPAROSCOPIC ASSISTED VAGINAL HYSTERECTOMY WITH BILATERAL SALPINGO OOPHORECTOMY (Bilateral)  Patient location during evaluation: PACU Anesthesia Type: General Level of consciousness: awake and alert Pain management: pain level controlled Vital Signs Assessment: post-procedure vital signs reviewed and stable Respiratory status: spontaneous breathing, nonlabored ventilation, respiratory function stable and patient connected to nasal cannula oxygen Cardiovascular status: blood pressure returned to baseline and stable Postop Assessment: no signs of nausea or vomiting Anesthetic complications: no    Last Vitals:  Vitals:   01/06/16 2144 01/06/16 2253  BP: 136/77 131/70  Pulse: 79 73  Resp: 18 15  Temp: 36.8 C 36.4 C    Last Pain:  Vitals:   01/06/16 2325  TempSrc:   PainSc: Asleep                 Martha Clan

## 2016-01-07 NOTE — Progress Notes (Signed)
1 Day Post-Op Procedure(s) (LRB): LAPAROSCOPIC ASSISTED VAGINAL HYSTERECTOMY WITH BILATERAL SALPINGO OOPHORECTOMY (Bilateral)  Subjective: Patient reports nausea, vomiting, incisional pain and no problems voiding.  Was able to tolerate food overnight, but had nausea/vomiting this morning.  Is ambulating.   Objective: I have reviewed patient's vital signs, intake and output, medications and labs.  General: alert and no distress Resp: clear to auscultation bilaterally Cardio: regular rate and rhythm, S1, S2 normal, no murmur, click, rub or gallop GI: soft, non-tender; bowel sounds normal; no masses,  no organomegaly.  Incision sites c/d/i with Liquiband. Extremities: extremities normal, atraumatic, no cyanosis or edema Vaginal Bleeding: none  Assessment: s/p Procedure(s): LAPAROSCOPIC ASSISTED VAGINAL HYSTERECTOMY WITH BILATERAL SALPINGO OOPHORECTOMY (Bilateral): stable, progressing well and not tolerating diet this morning.   Plan: Encourage ambulation Discontinue IV fluids  Can continue regular diet as tolerated, but would encourage drier foods (more breads/rice/pasta), avoiding acid (patient had orange juice this morning).  Zofran as needed for nausea/vomiting.  PO pain meds as tolerated.  Possible d/c home later this evening if improved. Otherwise, will remain for an additional day.    LOS: 0 days    Rubie Maid 01/07/2016, 8:12 AM

## 2016-01-08 ENCOUNTER — Encounter: Payer: Self-pay | Admitting: Obstetrics and Gynecology

## 2016-01-08 LAB — SURGICAL PATHOLOGY

## 2016-01-15 ENCOUNTER — Telehealth: Payer: Self-pay | Admitting: Obstetrics and Gynecology

## 2016-01-15 NOTE — Telephone Encounter (Signed)
PT CALLED AND SHE HAD A HYSTERECTOMY ON 8/21, AND SHE HAD SOME QUESTIONS AS TO WHAT SHE CAN DO, PT WOULD LIKE A CALL BACK.

## 2016-01-15 NOTE — Telephone Encounter (Signed)
Called pt she states that she wanted to know if she could drive. Advised pt that she could drive is absolutely neccessary advised pt on the use of pillow over abdomen while driving. Pt gave verbal understanding.

## 2016-01-20 ENCOUNTER — Encounter: Payer: Self-pay | Admitting: Medical Oncology

## 2016-01-20 ENCOUNTER — Emergency Department
Admission: EM | Admit: 2016-01-20 | Discharge: 2016-01-20 | Disposition: A | Discharge status: Home / Self Care | Payer: BLUE CROSS/BLUE SHIELD | Attending: Emergency Medicine | Admitting: Emergency Medicine

## 2016-01-20 DIAGNOSIS — G8918 Other acute postprocedural pain: Secondary | ICD-10-CM | POA: Diagnosis not present

## 2016-01-20 DIAGNOSIS — N938 Other specified abnormal uterine and vaginal bleeding: Secondary | ICD-10-CM | POA: Diagnosis not present

## 2016-01-20 DIAGNOSIS — R1030 Lower abdominal pain, unspecified: Secondary | ICD-10-CM | POA: Insufficient documentation

## 2016-01-20 LAB — PROTIME-INR
INR: 1.02
PROTHROMBIN TIME: 13.4 s (ref 11.4–15.2)

## 2016-01-20 LAB — BASIC METABOLIC PANEL
Anion gap: 6 (ref 5–15)
BUN: 16 mg/dL (ref 6–20)
CALCIUM: 9.2 mg/dL (ref 8.9–10.3)
CHLORIDE: 106 mmol/L (ref 101–111)
CO2: 27 mmol/L (ref 22–32)
CREATININE: 0.58 mg/dL (ref 0.44–1.00)
Glucose, Bld: 101 mg/dL — ABNORMAL HIGH (ref 65–99)
Potassium: 3.8 mmol/L (ref 3.5–5.1)
SODIUM: 139 mmol/L (ref 135–145)

## 2016-01-20 LAB — CBC WITH DIFFERENTIAL/PLATELET
Basophils Absolute: 0 10*3/uL (ref 0–0.1)
EOS ABS: 0.1 10*3/uL (ref 0–0.7)
HCT: 37 % (ref 35.0–47.0)
HEMOGLOBIN: 13.6 g/dL (ref 12.0–16.0)
Lymphocytes Relative: 16 %
Lymphs Abs: 0.9 10*3/uL — ABNORMAL LOW (ref 1.0–3.6)
MCH: 31.8 pg (ref 26.0–34.0)
MCHC: 36.7 g/dL — AB (ref 32.0–36.0)
MCV: 86.6 fL (ref 80.0–100.0)
Monocytes Absolute: 0.3 10*3/uL (ref 0.2–0.9)
Neutro Abs: 4.4 10*3/uL (ref 1.4–6.5)
PLATELETS: 217 10*3/uL (ref 150–440)
RBC: 4.28 MIL/uL (ref 3.80–5.20)
RDW: 12.9 % (ref 11.5–14.5)
WBC: 5.7 10*3/uL (ref 3.6–11.0)

## 2016-01-20 MED ORDER — ACETAMINOPHEN 500 MG PO TABS
1000.0000 mg | ORAL_TABLET | Freq: Once | ORAL | Status: AC
  Administered 2016-01-20: 1000 mg via ORAL
  Filled 2016-01-20: qty 2

## 2016-01-20 NOTE — ED Notes (Signed)
Pt refferred to ED by her SIL who is a midwife and told pt to get checked out. Pt states she has had blood soak through a thin pad, underwear, and pajamas with small amount in toilet bowl. No c/o GI or GU problems. No c/o lightheadedness/dizziness. S/p complete hysterectomy. C/o pelvic "discomfort but not really pain." Took tylenol this a.m.

## 2016-01-20 NOTE — Discharge Instructions (Signed)
Return emergency department especially for fever, heavy vaginal bleeding of more than 1 full pad per hour over 4 consecutive hours. Return also for feelings of lightheadedness, increased pain, or any other new concerns. Please contact her OB/GYN and schedule follow-up and informed them of your visit here to the emergency department. Continue Tylenol for pain and drink plenty of fluids. No strenuous activity or sexual activity.

## 2016-01-20 NOTE — ED Triage Notes (Signed)
Pt reports she had total vaginal hysterectomy 2 weeks ago and Saturday she noticed a smearing of vaginal bleeding and this am bleeding worsened. Pt reports some lower abd discomfort. Dr Marcelline Mates performed surgery.

## 2016-01-20 NOTE — ED Provider Notes (Signed)
Time Seen: Approximately 0945  I have reviewed the triage notes  Chief Complaint: Post-op Problem and Vaginal Bleeding   History of Present Illness: Karen Rodriguez is a 50 y.o. female who presents with some crampy lower middle quadrant abdominal pain who is postoperative from a complete hysterectomy. The pathology shows hyperplasia of the endometrium patient states she developed some postoperative constipation but otherwise has been doing well and denies any complications from her surgery which occurred 2 weeks ago. She states she developed some crampy pain and noticed some vaginal bleeding. She shows me a picture that shows approximately half a pad for bright red blood. She denies any fever or significant vaginal discharge. She denies any dysuria, hematuria, or urinary frequency   Past Medical History:  Diagnosis Date  . Anxiety state, unspecified   . Chest pain, unspecified 1997   negative cardiolyte and echo  . Diverticulitis 2009, 2012   dx by CT x 2 (2009, 2012).  never had colonoscopy done.  . Multinodular goiter   . Panic attacks   . Recurrent sinusitis   . Seasonal allergies   . Squamous cell skin cancer   . Subclinical hyperthyroidism 2007    Patient Active Problem List   Diagnosis Date Noted  . S/P laparoscopic assisted vaginal hysterectomy (LAVH) 01/06/2016  . Sprain and strain of ribs 12/19/2015  . Family history of breast cancer 11/15/2015  . DUB (dysfunctional uterine bleeding) 11/15/2015  . Endometrial hyperplasia without atypia, complex 11/15/2015  . History of breast lump 11/15/2015  . Left breast mass 10/07/2015  . Cough 12/18/2014  . Right foot pain 12/18/2014  . Breast cyst 06/20/2014  . Right lateral epicondylitis 01/04/2013  . Thyroid enlargement 01/04/2013  . Radicular pain 04/09/2012  . LLQ pain 10/15/2010  . MUSCLE PAIN 12/05/2009  . DIVERTICULITIS OF COLON 03/29/2008  . ANXIETY 09/01/2007  . CHEST PAIN 09/01/2007    Past Surgical History:   Procedure Laterality Date  . AUGMENTATION MAMMAPLASTY Bilateral   . BREAST BIOPSY Left 1996   benign palpable mass excised.  Marland Kitchen BREAST ENHANCEMENT SURGERY  1988   Dr. Cathie Hoops; then revised in [facility] ([place])  . cardiolite  1997   normal, negative echo (Dr. Enzo Bi)  . CT ABD W & PELVIS WO CM  03/2008   splenomegaly, sigmoid diverticulitis, 2cm dermoid L ovary, constipation  . CT ABD W & PELVIS WO CM  09/2010   acute focal uncomplicated upper sigmoid diverticulosis, dermoid cyst L ovary, constipation  . FINE NEEDLE ASPIRATION Right 11/2012   thyroid nodule - benign follicular cells consistent with colloid nodule Tami Ribas)  . LAPAROSCOPIC VAGINAL HYSTERECTOMY WITH SALPINGO OOPHORECTOMY Bilateral 01/06/2016   Procedure: LAPAROSCOPIC ASSISTED VAGINAL HYSTERECTOMY WITH BILATERAL SALPINGO OOPHORECTOMY;  Surgeon: Rubie Maid, MD;  Location: [facility] ORS;  Service: Gynecology;  Laterality: Bilateral;  . nsvd     x2  . tear duct surgery  06/1998   Right  . thyroid uptake scan  2007   early hyperthyroidism, suggestive early toxic adenomas of left lobe ([place])  . thyroid US  2007   benign cyst - R lobe 56mm, 1.1cm mid post left lobe, 1.2 cm mid left lobe  . TONSILLECTOMY      Past Surgical History:  Procedure Laterality Date  . AUGMENTATION MAMMAPLASTY Bilateral   . BREAST BIOPSY Left 1996   benign palpable mass excised.  Marland Kitchen BREAST ENHANCEMENT SURGERY  1988   Dr. Cathie Hoops; then revised in [facility] ([place])  . cardiolite  1997   normal,  negative echo (Dr. Enzo Bi)  . CT ABD W & PELVIS WO CM  03/2008   splenomegaly, sigmoid diverticulitis, 2cm dermoid L ovary, constipation  . CT ABD W & PELVIS WO CM  09/2010   acute focal uncomplicated upper sigmoid diverticulosis, dermoid cyst L ovary, constipation  . FINE NEEDLE ASPIRATION Right 11/2012   thyroid nodule - benign follicular cells consistent with colloid nodule Tami Ribas)  . LAPAROSCOPIC VAGINAL HYSTERECTOMY WITH SALPINGO OOPHORECTOMY Bilateral  01/06/2016   Procedure: LAPAROSCOPIC ASSISTED VAGINAL HYSTERECTOMY WITH BILATERAL SALPINGO OOPHORECTOMY;  Surgeon: Rubie Maid, MD;  Location: [facility] ORS;  Service: Gynecology;  Laterality: Bilateral;  . nsvd     x2  . tear duct surgery  06/1998   Right  . thyroid uptake scan  2007   early hyperthyroidism, suggestive early toxic adenomas of left lobe ([place])  . thyroid US  2007   benign cyst - R lobe 61mm, 1.1cm mid post left lobe, 1.2 cm mid left lobe  . TONSILLECTOMY      Current Outpatient Rx  . Order #: [id number] Class: Print  . Order #: [id number] Class: Print  . Order #: [id number] Class: Print  . Order #: [id number] Class: Print    Allergies:  Azithromycin; Celecoxib; and Codeine  Family History: Family History  Problem Relation Age of Onset  . Breast cancer Mother 58  . Hypertension Mother   . Other Father     CCY  . Breast cancer Cousin   . Stroke      GM    Social History: Social History  Substance Use Topics  . Smoking status: Never Smoker  . Smokeless tobacco: Never Used  . Alcohol use 1.8 - 2.4 oz/week    3 - 4 Glasses of wine per week     Comment: Glass of wine occasionally     Review of Systems:   10 point review of systems was performed and was otherwise negative:  Constitutional: No fever Eyes: No visual disturbances ENT: No sore throat, ear pain Cardiac: No chest pain Respiratory: No shortness of breath, wheezing, or stridor Abdomen: No abdominal pain, no vomiting, No diarrhea Endocrine: No weight loss, No night sweats Extremities: No peripheral edema, cyanosis Skin: No rashes, easy bruising Neurologic: No focal weakness, trouble with speech or swollowing Urologic: No dysuria, Hematuria, or urinary frequency   Physical Exam:  ED Triage Vitals [01/20/16 0928]  Enc Vitals Group     BP 124/80     Pulse Rate 81     Resp 18     Temp 98.1 F (36.7 C)     Temp Source Oral     SpO2 98 %     Weight 156 lb (70.8 kg)     Height 5\' 8"  (1.727 m)      Head Circumference      Peak Flow      Pain Score 4     Pain Loc      Pain Edu?      Excl. in [place]?     General: Awake , Alert , and Oriented times 3; GCS 15 Head: Normal cephalic , atraumatic Eyes: Pupils equal , round, reactive to light Nose/Throat: No nasal drainage, patent upper airway without erythema or exudate.  Neck: Supple, Full range of motion, No anterior adenopathy or palpable thyroid masses Lungs: Clear to ascultation without wheezes , rhonchi, or rales Heart: Regular rate, regular rhythm without murmurs , gallops , or rubs Abdomen: Patient has well healing well aligned postoperative scars. There is no erythema,  exudate or drainage. Soft, non tender without rebound, guarding , or rigidity; bowel sounds positive and symmetric in all 4 quadrants. No organomegaly .        Extremities: 2 plus symmetric pulses. No edema, clubbing or cyanosis Neurologic: normal ambulation, Motor symmetric without deficits, sensory intact Skin: warm, dry, no rashes  Pelvic exam with chaperone present shows some dark clotted blood in the vaginal vault with no signs of active bleeding with a cervical cuff Labs:   All laboratory work was reviewed including any pertinent negatives or positives listed below:  Labs Reviewed  [place]  Laboratory work was reviewed and showed no clinically significant abnormalities.   ED Course: Patient remained hemodynamically stable and her hemoglobin is stable here in emergency department. She is afebrile with a normal white blood cell count I did not suspect any significant postoperative complications. I reviewed the case with Dr. Glennon Mac who is on call for Dr. Marcelline Mates and we agree with outpatient management and this is likely a postoperative resolving lower hematoma.. Patient was advised to call her OB/GYN in the morning to plan further outpatient evaluation. She was advised to return here if she develops  a fever large persistent amount of vaginal bleeding, or any other new concerns Clinical Course     Assessment: * Postoperative bleeding Recent complete hysterectomy      Plan: * Outpatient management Patient was advised to return immediately if condition worsens. Patient was advised to follow up with their primary care physician or other specialized physicians involved in their outpatient care. The patient and/or family member/power of attorney had laboratory results reviewed at the bedside. All questions and concerns were addressed and appropriate discharge instructions were distributed by the nursing staff.             Daymon Larsen, MD 01/20/16 [phone]

## 2016-01-20 NOTE — ED Notes (Signed)

## 2016-01-23 ENCOUNTER — Encounter: Payer: Self-pay | Admitting: Obstetrics and Gynecology

## 2016-01-23 ENCOUNTER — Ambulatory Visit (INDEPENDENT_AMBULATORY_CARE_PROVIDER_SITE_OTHER): Payer: BLUE CROSS/BLUE SHIELD | Admitting: Obstetrics and Gynecology

## 2016-01-23 VITALS — BP 120/87 | HR 90 | Ht 68.0 in | Wt 150.8 lb

## 2016-01-23 DIAGNOSIS — Z90710 Acquired absence of both cervix and uterus: Secondary | ICD-10-CM

## 2016-01-23 DIAGNOSIS — Z4889 Encounter for other specified surgical aftercare: Secondary | ICD-10-CM

## 2016-01-23 DIAGNOSIS — N898 Other specified noninflammatory disorders of vagina: Secondary | ICD-10-CM

## 2016-01-23 NOTE — Progress Notes (Signed)
    Subjective:     Karen Rodriguez is a 50 y.o. female who presents to the clinic 2 weeks status post laparoscopic assisted vaginal hysterectomy and BSO for endoemtrial complex hyperplasia without atypia. Eating a regular diet without difficulty. Bowel movements are normal. The patient is not having any pain.   Patient reports being seen in the Emergency Room 3 days ago for onset of heavy vaginal bleeding and crampy lower abdominal pain.  Notes that she was told that she may have had a hematoma.  No imaging or further workup done.   Notes that after bleeding episode, she has only noted occasional spotting since then.   The following portions of the patient's history were reviewed and updated as appropriate: allergies, current medications, past family history, past medical history, past social history, past surgical history and problem list.  Review of Systems A comprehensive review of systems was negative except for: Gastrointestinal: positive for abdominal pain and constipation last week (notes she was impacted, took laxatives without relief, had to use enemas and manual removal).  Genitourinary: positive for vaginal bleeding (occurred 1 day after dis-impactment)   Objective:    BP 120/87 (BP Location: Left Arm, Patient Position: Sitting, Cuff Size: Normal)   Pulse 90   Ht 5\' 8"  (1.727 m)   Wt 150 lb 12.8 oz (68.4 kg)   LMP 12/17/2015 (Exact Date)   BMI 22.93 kg/m  General:  alert and no distress  Abdomen: soft, bowel sounds active, non-tender  Incision:   healing well, no drainage, no erythema, no hernia, no seroma, no swelling, no dehiscence, incision well approximated  Pelvis:   external genitalia normal, rectovaginal septum normal.  Vagina without discharge.  Vaginal cuff healing well, slightly tender to palpation, no obvious defects, suture material present.         Lab Results  Component Value Date   HGB 13.6 01/20/2016     Pathology (01/06/2016):  DIAGNOSIS:  A. UTERUS  WITH CERVIX; HYSTERECTOMY:  - CERVIX WITHOUT PATHOLOGIC CHANGES.  - FOCAL DISORDERED GLANDULAR ARCHITECTURE IN A BACKGROUND OF SECRETORY ENDOMETRIUM.  - INTRAMURAL AND SUBMUCOSAL LEIOMYOMAS (157 GRAM UTERUS).  - NEGATIVE FOR ATYPIA AND MALIGNANCY.   LEFT OVARY; OOPHORECTOMY:  - MATURE CYSTIC TERATOMA (DERMOID CYST).   LEFT FALLOPIAN TUBE; SALPINGECTOMY:  - PARATUBAL CYST.   RIGHT FALLOPIAN TUBE AND OVARY; SALPINGO-OOPHORECTOMY:  - NO PATHOLOGIC CHANGES.   Assessment:    Doing well postoperatively.  S/p LAVH with BSO Post-operative hematoma, likely resolved.  Plan:   1.Continue any current medications as needed. 2. Wound care discussed. 3. Activity restrictions: no intercourse, heavy lifting over 20 lbs 4. Operative findings reviewed, pathology discussed.  5. Anticipated return to work: 1-2 weeks. 6. Follow up: 3 weeks for final post-op check.      Rubie Maid, MD [facility]

## 2016-01-26 ENCOUNTER — Encounter: Payer: Self-pay | Admitting: Family Medicine

## 2016-02-18 ENCOUNTER — Encounter: Payer: Self-pay | Admitting: Obstetrics and Gynecology

## 2016-02-18 ENCOUNTER — Ambulatory Visit (INDEPENDENT_AMBULATORY_CARE_PROVIDER_SITE_OTHER): Payer: BLUE CROSS/BLUE SHIELD | Admitting: Obstetrics and Gynecology

## 2016-02-18 VITALS — BP 138/83 | HR 88 | Ht 68.0 in | Wt 153.4 lb

## 2016-02-18 DIAGNOSIS — Z9889 Other specified postprocedural states: Secondary | ICD-10-CM

## 2016-02-18 DIAGNOSIS — F39 Unspecified mood [affective] disorder: Secondary | ICD-10-CM

## 2016-02-18 DIAGNOSIS — E8941 Symptomatic postprocedural ovarian failure: Secondary | ICD-10-CM

## 2016-02-18 DIAGNOSIS — Z90710 Acquired absence of both cervix and uterus: Secondary | ICD-10-CM

## 2016-02-18 DIAGNOSIS — R4586 Emotional lability: Secondary | ICD-10-CM

## 2016-02-18 NOTE — Progress Notes (Signed)
   GYNECOLOGY POST-OPERATIVE CLINIC PROGRESS NOTE  Subjective:     Karen Rodriguez is a 50 y.o. female who presents to the clinic 6 weeks status post laparoscopic assisted vaginal hysterectomy and BSO for endoemtrial complex hyperplasia without atypia. Eating a regular diet without difficulty. Bowel movements are normal. The patient is not having any pain. Notes that vaginal spotting has subsided.   The following portions of the patient's history were reviewed and updated as appropriate: allergies, current medications, past family history, past medical history, past social history, past surgical history and problem list.  Review of Systems A comprehensive review of systems was negative except for:  Mood changes (patient notes mild depression symptoms).  Hot flushes intermittent, mild.    Objective:    BP 138/83 (BP Location: Left Arm, Patient Position: Sitting, Cuff Size: Normal)   Pulse 88   Ht 5\' 8"  (1.727 m)   Wt 153 lb 6.4 oz (69.6 kg)   LMP 12/17/2015 (Exact Date)   BMI 23.32 kg/m  General:  alert and no distress  Abdomen: soft, bowel sounds active, non-tender  Incision:   healing well, no drainage, no erythema, no hernia, no seroma, no swelling, no dehiscence, incision well approximated  Pelvis:   external genitalia normal, rectovaginal septum normal.  Vagina without discharge or tenderness.  Vaginal cuff healing well, several areas of granulation tissue present.         Lab Results  Component Value Date   HGB 13.6 01/20/2016     Pathology (01/06/2016):  DIAGNOSIS:  A. UTERUS WITH CERVIX; HYSTERECTOMY:  - CERVIX WITHOUT PATHOLOGIC CHANGES.  - FOCAL DISORDERED GLANDULAR ARCHITECTURE IN A BACKGROUND OF SECRETORY ENDOMETRIUM.  - INTRAMURAL AND SUBMUCOSAL LEIOMYOMAS (157 GRAM UTERUS).  - NEGATIVE FOR ATYPIA AND MALIGNANCY.   LEFT OVARY; OOPHORECTOMY:  - MATURE CYSTIC TERATOMA (DERMOID CYST).   LEFT FALLOPIAN TUBE; SALPINGECTOMY:  - PARATUBAL CYST.   RIGHT FALLOPIAN  TUBE AND OVARY; SALPINGO-OOPHORECTOMY:  - NO PATHOLOGIC CHANGES.   Assessment:    Doing well postoperatively.  S/p LAVH with BSO Mood changes Surgical menopause  Plan:   1.Discussed that mood changes and hot flushes may be symptoms of surgical menopausal state.  Patient also reports that she has been under a lot of stress recently (has a parent living with her after a fall requiring surgery, husband's heart issues, her own recovery, recent death of a family member).  Discussed methods of stress relief. Also discussed referral to counselor if needed. In addition noted that hot flushes and mood changes could be attributed to new menopausal status. Discussed management options for mild vasomotor symptoms and mood changes, including HRT, nonhormonal therapy, and herbal remedies. Patient desires to try herbal remedies.  Will follow up for further discussion if prescription medications desired. 3. Activity restrictions: no intercourse for an additional 2 weeks 4. 5. Anticipated return to work: patient has already returned to work. 6. Follow up: 6-8 months as needed for annual exam.     Rubie Maid, MD [facility]

## 2016-03-30 DIAGNOSIS — E041 Nontoxic single thyroid nodule: Secondary | ICD-10-CM | POA: Diagnosis not present

## 2016-04-01 ENCOUNTER — Ambulatory Visit (INDEPENDENT_AMBULATORY_CARE_PROVIDER_SITE_OTHER): Payer: BLUE CROSS/BLUE SHIELD | Admitting: General Surgery

## 2016-04-01 ENCOUNTER — Encounter: Payer: Self-pay | Admitting: General Surgery

## 2016-04-01 ENCOUNTER — Inpatient Hospital Stay: Payer: Self-pay

## 2016-04-01 VITALS — BP 128/86 | HR 84 | Resp 12 | Ht 68.5 in | Wt 155.0 lb

## 2016-04-01 DIAGNOSIS — N6001 Solitary cyst of right breast: Secondary | ICD-10-CM

## 2016-04-01 NOTE — Progress Notes (Signed)
Patient ID: Karen Rodriguez, female   DOB: [birth year], 50 y.o.   MRN: [id number]  Chief Complaint  Patient presents with  . Breast Problem    HPI Karen Rodriguez is a 50 y.o. female.  Here today for evaluation of possible right breast cyst. She states she felt a nodule in the right breast about 6 weeks ago. She states it has not changed in size. She states it is about "green pea" size. Denies any breast in ury or trauma.   Mammograms were in May. She has been on an emotional roller coaster since total hysterectomy in August.   She had bad constipation afterwards due to the pain medications. once she got relief she had an episode of Rectal bleeding that turned out to be a blood pocket.  I personally reviewed the patient's history. HPI  Past Medical History:  Diagnosis Date  . Anxiety state, unspecified   . Chest pain, unspecified 1997   negative cardiolyte and echo  . Diverticulitis 2009, 2012   dx by CT x 2 (2009, 2012).  never had colonoscopy done.  . Multinodular goiter   . Panic attacks   . Recurrent sinusitis   . Seasonal allergies   . Squamous cell skin cancer   . Subclinical hyperthyroidism 2007  . Teratoma of left ovary 2017   s/p hysterectomy with LSO    Past Surgical History:  Procedure Laterality Date  . AUGMENTATION MAMMAPLASTY Bilateral   . BREAST BIOPSY Left 1996   benign palpable mass excised.  Marland Kitchen BREAST ENHANCEMENT SURGERY  1988   Dr. Cathie Hoops; then revised in [facility] ([place])  . cardiolite  1997   normal, negative echo (Dr. Enzo Bi)  . COLONOSCOPY  ?  . CT ABD W & PELVIS WO CM  03/2008   splenomegaly, sigmoid diverticulitis, 2cm dermoid L ovary, constipation  . CT ABD W & PELVIS WO CM  09/2010   acute focal uncomplicated upper sigmoid diverticulosis, dermoid cyst L ovary, constipation  . FINE NEEDLE ASPIRATION Right 11/2012   thyroid nodule - benign follicular cells consistent with colloid nodule Tami Ribas)  . LAPAROSCOPIC VAGINAL HYSTERECTOMY WITH  SALPINGO OOPHORECTOMY Bilateral 01/06/2016   LAPAROSCOPIC ASSISTED VAGINAL HYSTERECTOMY WITH BILATERAL SALPINGO OOPHORECTOMY;  Rubie Maid, MD  . nsvd     x2  . tear duct surgery  06/1998   Right  . thyroid uptake scan  2007   early hyperthyroidism, suggestive early toxic adenomas of left lobe ([place])  . thyroid US  2007   benign cyst - R lobe 80mm, 1.1cm mid post left lobe, 1.2 cm mid left lobe  . TONSILLECTOMY      Family History  Problem Relation Age of Onset  . Breast cancer Mother 54  . Hypertension Mother   . Other Father     CCY  . Breast cancer Cousin   . Stroke      GM    Social History Social History  Substance Use Topics  . Smoking status: Never Smoker  . Smokeless tobacco: Never Used  . Alcohol use 1.8 - 2.4 oz/week    3 - 4 Glasses of wine per week     Comment: Glass of wine occasionally    Allergies  Allergen Reactions  . Azithromycin     REACTION: UNSPECIFIED  . Celecoxib     REACTION: SWELLING  . Codeine Rash and Other (See Comments)    Upset stomach    Current Outpatient Prescriptions  Medication Sig Dispense Refill  . valACYclovir (VALTREX)  1000 MG tablet Take by mouth as needed.   5   No current facility-administered medications for this visit.     Review of Systems Review of Systems  Constitutional: Negative.   Respiratory: Negative.   Cardiovascular: Negative.     Blood pressure 128/86, pulse 84, resp. rate 12, height 5' 8.5" (1.74 m), weight 155 lb (70.3 kg), last menstrual period 12/17/2015, SpO2 98 %.  Physical Exam Physical Exam  Constitutional: She is oriented to person, place, and time. She appears well-developed and well-nourished.  Eyes: Conjunctivae are normal. No scleral icterus.  Neck: Neck supple.  Cardiovascular: Normal rate, regular rhythm and normal heart sounds.   Pulmonary/Chest: Effort normal and breath sounds normal. Right breast exhibits mass (1.5 cm nodule at 1 o'clk 5 cm from nipple). Right breast exhibits  no inverted nipple, no nipple discharge, no skin change and no tenderness. Left breast exhibits no inverted nipple, no mass, no nipple discharge, no skin change and no tenderness.    Lymphadenopathy:    She has no cervical adenopathy.  Neurological: She is alert and oriented to person, place, and time.  Skin: Skin is warm and dry.  Psychiatric: She has a normal mood and affect.    Data Reviewed Mammograms completed 09/20/2015 reported multiple, bilateral circumscribed masses. Ultrasound showed multiple cysts. Right breast ultrasound showed multiple benign cyst the largest measuring 3.8 cm. Left breast ultrasound suggested a hypoechoic mass at the 11:00 position torrentially representing a complicated cyst or solid mass. BI-RADS-3.  Ultrasound examination of the right breast in the area of visible and palpable abnormality at the 1:00 position 5 cm from the nipple showed a 1.2 x 1.55 x 1.88 cm simple cyst extending from the dermis to the pectoralis fascia. Multiple smaller adjacent cysts were identified. As this was symptomatic to the patient aspiration was offered and accepted. 1 mL of 1% plain Xylocaine was utilized. Under ultrasound guidance the area was aspirated with complete resolution. 2 mL of clear fluid was obtained. The procedure was well tolerated. No evidence of injury to the underlying prosthesis.  Assessment    Symptomatic breast cysts likely secondary to recent hormonal change after TAH/BSO.    Plan    Right breast cyst, resolved post aspiration.  The left breast ultrasound findings were not reviewed prior to the patient's leaving the office. She'll be asked to return at her convenience for reassessment of left breast and possible fine-needle aspiration cytology.          This has been scribed by Caryl-Lyn Otis Brace LPN      Robert Bellow 04/01/2016, 10:51 PM

## 2016-04-01 NOTE — Patient Instructions (Signed)
The patient is aware to call back for any questions or concerns.  

## 2016-04-02 ENCOUNTER — Telehealth: Payer: Self-pay | Admitting: *Deleted

## 2016-04-02 NOTE — Telephone Encounter (Signed)
-----   Message from Robert Bellow, MD sent at 04/01/2016 11:02 PM EST ----- Please notify the patient and reviewing her records that been a question about her left breast mammogram/ultrasound in May. She should have a follow-up ultrasound to reassess a focal thickening. This may require cytologic sampling. This may have been overlooked with the transition and care from [facility] to Dr. Marcelline Mates. She can return and have the exam completed here or he can be scheduled at [facility].

## 2016-04-02 NOTE — Telephone Encounter (Signed)
appt 04-15-16

## 2016-04-15 ENCOUNTER — Encounter: Payer: Self-pay | Admitting: General Surgery

## 2016-04-15 ENCOUNTER — Other Ambulatory Visit: Payer: Self-pay | Admitting: General Surgery

## 2016-04-15 ENCOUNTER — Ambulatory Visit (INDEPENDENT_AMBULATORY_CARE_PROVIDER_SITE_OTHER): Payer: BLUE CROSS/BLUE SHIELD | Admitting: General Surgery

## 2016-04-15 ENCOUNTER — Inpatient Hospital Stay: Payer: Self-pay

## 2016-04-15 VITALS — BP 152/88 | HR 76 | Resp 12 | Ht 68.0 in | Wt 156.0 lb

## 2016-04-15 DIAGNOSIS — N6320 Unspecified lump in the left breast, unspecified quadrant: Secondary | ICD-10-CM

## 2016-04-15 DIAGNOSIS — D4862 Neoplasm of uncertain behavior of left breast: Secondary | ICD-10-CM | POA: Diagnosis not present

## 2016-04-15 DIAGNOSIS — D242 Benign neoplasm of left breast: Secondary | ICD-10-CM | POA: Diagnosis not present

## 2016-04-15 HISTORY — PX: BREAST BIOPSY: SHX20

## 2016-04-15 NOTE — Patient Instructions (Addendum)
The patient is aware to call back for any questions or concerns. Follow up in May with bilateral screening and office visit.

## 2016-04-15 NOTE — Progress Notes (Addendum)
Patient ID: Karen Rodriguez, female   DOB: Jun 01, 1965, 50 y.o.   MRN: [id number]  Chief Complaint  Patient presents with  . Follow-up    HPI Karen Rodriguez is a 50 y.o. female here today for a left breast ultrasound. No new complaints.The patient was asked to return after her last visit when review of her left breast ultrasound had suggested a solid nodule.  HPI  Past Medical History:  Diagnosis Date  . Anxiety state, unspecified   . Chest pain, unspecified 1997   negative cardiolyte and echo  . Diverticulitis 2009, 2012   dx by CT x 2 (2009, 2012).  never had colonoscopy done.  . Multinodular goiter   . Panic attacks   . Recurrent sinusitis   . Seasonal allergies   . Squamous cell skin cancer   . Subclinical hyperthyroidism 2007  . Teratoma of left ovary 2017   s/p hysterectomy with LSO    Past Surgical History:  Procedure Laterality Date  . AUGMENTATION MAMMAPLASTY Bilateral   . BREAST BIOPSY Left 1996   benign palpable mass excised.  Marland Kitchen BREAST ENHANCEMENT SURGERY  1988   Dr. Cathie Hoops; then revised in [facility] ([place])  . cardiolite  1997   normal, negative echo (Dr. Enzo Bi)  . COLONOSCOPY  ?  . CT ABD W & PELVIS WO CM  03/2008   splenomegaly, sigmoid diverticulitis, 2cm dermoid L ovary, constipation  . CT ABD W & PELVIS WO CM  09/2010   acute focal uncomplicated upper sigmoid diverticulosis, dermoid cyst L ovary, constipation  . FINE NEEDLE ASPIRATION Right 11/2012   thyroid nodule - benign follicular cells consistent with colloid nodule Tami Ribas)  . LAPAROSCOPIC VAGINAL HYSTERECTOMY WITH SALPINGO OOPHORECTOMY Bilateral 01/06/2016   LAPAROSCOPIC ASSISTED VAGINAL HYSTERECTOMY WITH BILATERAL SALPINGO OOPHORECTOMY;  Rubie Maid, MD  . nsvd     x2  . tear duct surgery  06/1998   Right  . thyroid uptake scan  2007   early hyperthyroidism, suggestive early toxic adenomas of left lobe ([place])  . thyroid US  2007   benign cyst - R lobe 61mm, 1.1cm mid post left lobe,  1.2 cm mid left lobe  . TONSILLECTOMY      Family History  Problem Relation Age of Onset  . Breast cancer Mother 43  . Hypertension Mother   . Other Father     CCY  . Breast cancer Cousin   . Stroke      GM    Social History Social History  Substance Use Topics  . Smoking status: Never Smoker  . Smokeless tobacco: Never Used  . Alcohol use 1.8 - 2.4 oz/week    3 - 4 Glasses of wine per week     Comment: Glass of wine occasionally    Allergies  Allergen Reactions  . Azithromycin     REACTION: UNSPECIFIED  . Celecoxib     REACTION: SWELLING  . Codeine Rash and Other (See Comments)    Upset stomach    Current Outpatient Prescriptions  Medication Sig Dispense Refill  . valACYclovir (VALTREX) 1000 MG tablet Take by mouth as needed.   5   No current facility-administered medications for this visit.     Review of Systems Review of Systems  Constitutional: Negative.   Respiratory: Negative.   Cardiovascular: Negative.     Blood pressure (!) 152/88, pulse 76, resp. rate 12, height 5\' 8"  (1.727 m), weight 156 lb (70.8 kg), last menstrual period 12/17/2015.  Physical Exam Physical Exam  Constitutional: She is oriented to person, place, and time. She appears well-developed and well-nourished.  HENT:  Mouth/Throat: Oropharynx is clear and moist.  Eyes: Conjunctivae are normal. No scleral icterus.  Neck: Neck supple.  Cardiovascular: Normal rate, regular rhythm and normal heart sounds.   Pulmonary/Chest: Effort normal and breath sounds normal.    Lymphadenopathy:    She has no cervical adenopathy.  Neurological: She is alert and oriented to person, place, and time.  Skin: Skin is warm and dry.  Psychiatric: Her behavior is normal.    Data Reviewed Ultrasound examination of the left breast was completed compared to previous studies completed [facility]. She has a hypoechoic mass in the 11:00 position, 2 cm from the nipple adjacent to the pectoralis fascia. This  measures 0.6 x 0.86 x 1.07 cm. This is stable in size compared to her most recent [facility] study. A hyperechoic areas notable within the lesion. The patient was amenable to fine-needle aspiration cytology and this was completed using 1 mL of 1% plain Xylocaine. Multiple passes through the lesion were completed. Slides 2 were prepared for cytologic review. BI-RADS-3.  Assessment    Solid left breast mass, likely fibroadenoma.    Plan    The patient will be contacted when cytology results are available and further treatment plans at that time.     Follow up in May with bilateral screening and office visit.  This information has been scribed by Karie Fetch RN, BSN,BC.   Robert Bellow 05/21/2016, 8:43 AM

## 2016-04-20 ENCOUNTER — Telehealth: Payer: Self-pay | Admitting: General Surgery

## 2016-04-20 ENCOUNTER — Encounter: Payer: Self-pay | Admitting: General Surgery

## 2016-04-20 NOTE — Telephone Encounter (Signed)
The patient was notified that the pathology was benign cytology was benign.

## 2016-05-05 DIAGNOSIS — H11422 Conjunctival edema, left eye: Secondary | ICD-10-CM | POA: Diagnosis not present

## 2016-06-26 DIAGNOSIS — Z85828 Personal history of other malignant neoplasm of skin: Secondary | ICD-10-CM | POA: Diagnosis not present

## 2016-06-26 DIAGNOSIS — C44321 Squamous cell carcinoma of skin of nose: Secondary | ICD-10-CM | POA: Diagnosis not present

## 2016-06-26 DIAGNOSIS — D485 Neoplasm of uncertain behavior of skin: Secondary | ICD-10-CM | POA: Diagnosis not present

## 2016-07-16 ENCOUNTER — Other Ambulatory Visit: Payer: Self-pay

## 2016-07-16 DIAGNOSIS — Z1231 Encounter for screening mammogram for malignant neoplasm of breast: Secondary | ICD-10-CM

## 2016-08-13 DIAGNOSIS — L908 Other atrophic disorders of skin: Secondary | ICD-10-CM | POA: Diagnosis not present

## 2016-08-13 DIAGNOSIS — L578 Other skin changes due to chronic exposure to nonionizing radiation: Secondary | ICD-10-CM | POA: Diagnosis not present

## 2016-08-13 DIAGNOSIS — C44321 Squamous cell carcinoma of skin of nose: Secondary | ICD-10-CM | POA: Diagnosis not present

## 2016-08-13 DIAGNOSIS — L814 Other melanin hyperpigmentation: Secondary | ICD-10-CM | POA: Diagnosis not present

## 2016-09-23 ENCOUNTER — Telehealth: Payer: Self-pay | Admitting: Family Medicine

## 2016-09-23 ENCOUNTER — Ambulatory Visit (INDEPENDENT_AMBULATORY_CARE_PROVIDER_SITE_OTHER): Payer: BLUE CROSS/BLUE SHIELD | Admitting: Family Medicine

## 2016-09-23 ENCOUNTER — Encounter: Payer: Self-pay | Admitting: Family Medicine

## 2016-09-23 DIAGNOSIS — M792 Neuralgia and neuritis, unspecified: Secondary | ICD-10-CM | POA: Diagnosis not present

## 2016-09-23 MED ORDER — VALACYCLOVIR HCL 1 G PO TABS: 1000.0000 mg | ORAL_TABLET | Freq: Three times a day (TID) | ORAL | 0 refills | Status: DC

## 2016-09-23 NOTE — Progress Notes (Signed)
Pre visit review using our clinic review tool, if applicable. No additional management support is needed unless otherwise documented below in the visit note. 

## 2016-09-23 NOTE — Progress Notes (Signed)
Subjective:   Patient ID: Karen Rodriguez, female    DOB: [birth year], 51 y.o.   MRN: [id number]  Karen Rodriguez is a pleasant 51 y.o. year old female pt of Dr. Darnell Level, new to me, who presents to clinic today with Thoracic Pain (Upper thoracic pain/tingling for several weeks)  on 09/23/2016  HPI:  Right upper thoracic pain/tingling- starts in midline but she does feel it goes more to right back but she cannot say for sure that it does not cross the midline.  Notes it a few weeks ago after her mom had a stroke but now it is occurring more frequently.    Feeling a warm and cold, pain sensation.  No rash in that distribuation that she is aware of.  No back or neck pain/injury.  No UE weakness.  No current outpatient prescriptions on file prior to visit.   No current facility-administered medications on file prior to visit.     Allergies  Allergen Reactions  . Azithromycin     REACTION: UNSPECIFIED  . Celecoxib     REACTION: SWELLING  . Codeine Rash and Other (See Comments)    Upset stomach    Past Medical History:  Diagnosis Date  . Anxiety state, unspecified   . Chest pain, unspecified 1997   negative cardiolyte and echo  . Diverticulitis 2009, 2012   dx by CT x 2 (2009, 2012).  never had colonoscopy done.  . Multinodular goiter   . Panic attacks   . Recurrent sinusitis   . Seasonal allergies   . Squamous cell skin cancer   . Subclinical hyperthyroidism 2007  . Teratoma of left ovary 2017   s/p hysterectomy with LSO    Past Surgical History:  Procedure Laterality Date  . AUGMENTATION MAMMAPLASTY Bilateral   . BREAST BIOPSY Left 1996   benign palpable mass excised.  Marland Kitchen BREAST ENHANCEMENT SURGERY  1988   Dr. Cathie Hoops; then revised in [facility] ([place])  . cardiolite  1997   normal, negative echo (Dr. Enzo Bi)  . COLONOSCOPY  ?  . CT ABD W & PELVIS WO CM  03/2008   splenomegaly, sigmoid diverticulitis, 2cm dermoid L ovary, constipation  . CT ABD W & PELVIS WO CM   09/2010   acute focal uncomplicated upper sigmoid diverticulosis, dermoid cyst L ovary, constipation  . FINE NEEDLE ASPIRATION Right 11/2012   thyroid nodule - benign follicular cells consistent with colloid nodule Tami Ribas)  . LAPAROSCOPIC VAGINAL HYSTERECTOMY WITH SALPINGO OOPHORECTOMY Bilateral 01/06/2016   LAPAROSCOPIC ASSISTED VAGINAL HYSTERECTOMY WITH BILATERAL SALPINGO OOPHORECTOMY;  Rubie Maid, MD  . nsvd     x2  . tear duct surgery  06/1998   Right  . thyroid uptake scan  2007   early hyperthyroidism, suggestive early toxic adenomas of left lobe ([place]ati)  . thyroid US  2007   benign cyst - R lobe 17mm, 1.1cm mid post left lobe, 1.2 cm mid left lobe  . TONSILLECTOMY      Family History  Problem Relation Age of Onset  . Breast cancer Mother 17  . Hypertension Mother   . Other Father     CCY  . Breast cancer Cousin   . Stroke      GM    Social History   Social History  . Marital status: Married    Spouse name: N/A  . Number of children: 2  . Years of education: N/A   Occupational History  . Glass blower/designer    Social History  Main Topics  . Smoking status: Never Smoker  . Smokeless tobacco: Never Used  . Alcohol use 1.8 - 2.4 oz/week    3 - 4 Glasses of wine per week     Comment: Glass of wine occasionally  . Drug use: No  . Sexual activity: Yes    Birth control/ protection: None     Comment: Husband had vasectomcy 1996   Other Topics Concern  . 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   Review of Systems  Constitutional: Negative.   Musculoskeletal: Positive for back pain. Negative for neck pain and neck stiffness.  Skin: Negative for rash.  Neurological: Negative for dizziness, tremors, syncope, facial asymmetry,  weakness, light-headedness, numbness and headaches.  All other systems reviewed and are negative.      Objective:    BP 110/80 (BP Location: Left Arm, Patient Position: Sitting, Cuff Size: Normal)   Pulse 81   Temp 97.9 F (36.6 C) (Oral)   Wt 158 lb (71.7 kg)   LMP 12/17/2015 (Exact Date)   SpO2 98%   BMI 24.02 kg/m    Physical Exam  Constitutional: She is oriented to person, place, and time. She appears well-developed and well-nourished. No distress.  HENT:  Head: Normocephalic and atraumatic.  Eyes: Conjunctivae are normal.  Cardiovascular: Normal rate.   Pulmonary/Chest: Effort normal.  Musculoskeletal: Normal range of motion. She exhibits no edema.  Neurological: She is alert and oriented to person, place, and time. No cranial nerve deficit.  Skin: Skin is warm and dry. No rash noted. She is not diaphoretic.  Psychiatric: She has a normal mood and affect. Her behavior is normal. Judgment and thought content normal.  Nursing note and vitals reviewed.         Assessment & Plan:   Neuropathic pain No Follow-up on file.

## 2016-09-23 NOTE — Telephone Encounter (Signed)
Pt has appt with Dr Deborra Medina on 09/23/16 at 12:45

## 2016-09-23 NOTE — Patient Instructions (Signed)
Nice to meet you.  Please take valtrex as directed- 1 tablet 3 times daily for 7 days.  Please make an appointment with Dr. Darnell Level as a follow up and to discuss your sleep issues.

## 2016-09-23 NOTE — Assessment & Plan Note (Signed)
Zoster pain (without rash)- vs nerve impingement. Will treat with 1 week course of valtrex 1 gram three times daily and follow up with PCP next week. The patient indicates understanding of these issues and agrees with the plan.

## 2016-09-23 NOTE — Telephone Encounter (Signed)
Patient Name: Karen Rodriguez DOB: [birth year] Initial Comment Caller has a strange sensation in her upper back, some pain and feels like something is on her back for the last 3 days Nurse Assessment Nurse: Andria Frames, RN, Aeriel Date/Time (Eastern Time): 09/23/2016 9:05:00 AM Confirm and document reason for call. If symptomatic, describe symptoms. ---Caller has a strange sensation in her upper back, some pain and feels like something is on her back for the last 3 days. Caller states, she had some pain the other week her mom, it is a tingling numb sensations, it feels like someone is touching her back. The other night it felt like she had heat on her back in that area. Caller states, no rash. She is not having pain right now. She is achy and tired. Caller states, she has not had any chest pain recently before this ever started. Does the patient have any new or worsening symptoms? ---Yes Will a triage be completed? ---Yes Related visit to physician within the last 2 weeks? ---No Does the PT have any chronic conditions? (i.e. diabetes, asthma, etc.) ---No Is the patient pregnant or possibly pregnant? (Ask all females between the ages of 50-55) ---No Is this a behavioral health or substance abuse call? ---No Guidelines Guideline Title Affirmed Question Affirmed Notes Neurologic Deficit Back pain (and neurologic deficit) Final Disposition User See Physician within 4 Hours (or PCP triage) Hensel, RN, Aeriel Comments It is tingling feeling not numbness. Referrals REFERRED TO PCP OFFICE Disagree/Comply: Comply

## 2016-09-29 ENCOUNTER — Other Ambulatory Visit: Payer: Self-pay | Admitting: Obstetrics and Gynecology

## 2016-09-29 DIAGNOSIS — R928 Other abnormal and inconclusive findings on diagnostic imaging of breast: Secondary | ICD-10-CM

## 2016-10-05 ENCOUNTER — Other Ambulatory Visit: Payer: Self-pay | Admitting: Obstetrics and Gynecology

## 2016-10-05 DIAGNOSIS — R928 Other abnormal and inconclusive findings on diagnostic imaging of breast: Secondary | ICD-10-CM

## 2016-10-07 ENCOUNTER — Other Ambulatory Visit: Payer: Self-pay | Admitting: Obstetrics and Gynecology

## 2016-10-07 ENCOUNTER — Ambulatory Visit: Payer: BLUE CROSS/BLUE SHIELD

## 2016-10-07 DIAGNOSIS — R928 Other abnormal and inconclusive findings on diagnostic imaging of breast: Secondary | ICD-10-CM

## 2016-10-13 ENCOUNTER — Ambulatory Visit: Payer: BLUE CROSS/BLUE SHIELD | Admitting: Family Medicine

## 2016-10-13 DIAGNOSIS — M9902 Segmental and somatic dysfunction of thoracic region: Secondary | ICD-10-CM | POA: Diagnosis not present

## 2016-10-13 DIAGNOSIS — M9901 Segmental and somatic dysfunction of cervical region: Secondary | ICD-10-CM | POA: Diagnosis not present

## 2016-10-13 DIAGNOSIS — M4003 Postural kyphosis, cervicothoracic region: Secondary | ICD-10-CM | POA: Diagnosis not present

## 2016-10-13 DIAGNOSIS — M531 Cervicobrachial syndrome: Secondary | ICD-10-CM | POA: Diagnosis not present

## 2016-10-22 ENCOUNTER — Ambulatory Visit: Payer: BLUE CROSS/BLUE SHIELD | Admitting: General Surgery

## 2016-10-27 ENCOUNTER — Ambulatory Visit: Payer: BLUE CROSS/BLUE SHIELD

## 2016-10-27 ENCOUNTER — Other Ambulatory Visit: Payer: Self-pay

## 2016-10-28 DIAGNOSIS — M546 Pain in thoracic spine: Secondary | ICD-10-CM | POA: Diagnosis not present

## 2016-10-30 DIAGNOSIS — M546 Pain in thoracic spine: Secondary | ICD-10-CM | POA: Diagnosis not present

## 2016-10-31 IMAGING — US US BREAST*R* LIMITED INC AXILLA
1 series · 12 of 12 positions shown · non-contrast
Comparison: 06/09/2012 and priors.

CLINICAL DATA: Palpable right breast mass and associated pain, of
note pain has decreased since starting antibiotics. Annual left
mammogram.

EXAM:
DIGITAL DIAGNOSTIC BILATERAL MAMMOGRAM WITH IMPLANTS AND CAD
ULTRASOUND RIGHT BREAST
The patient has retropectoral implants. Standard and implant
displaced views were performed.

[Series 1: us breast*right* limited inc axilla · 0.08mm/px · 12 of 12 slices shown]
[im 1/12]
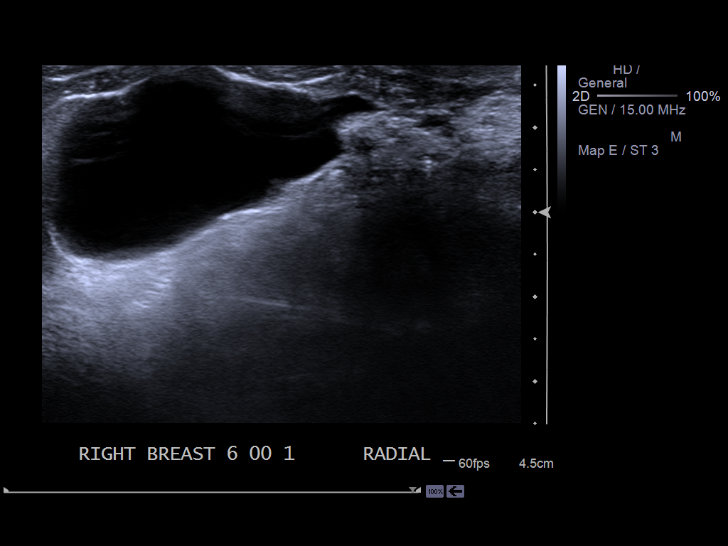
[im 2/12]
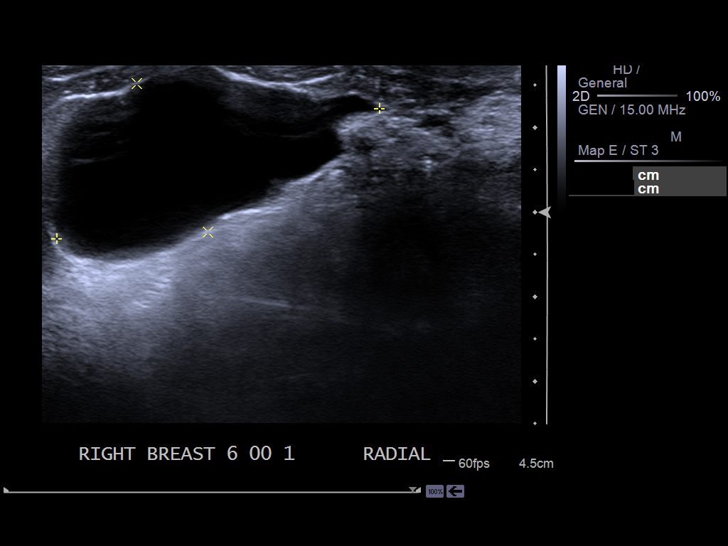
[im 3/12]
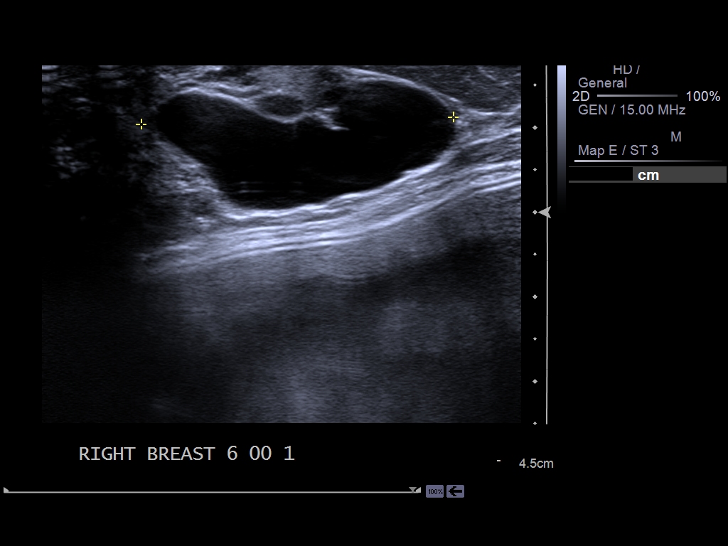
[im 4/12]
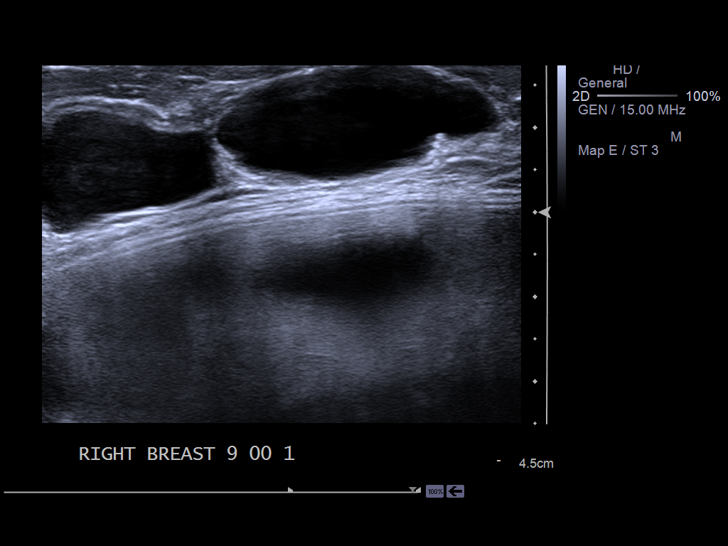
[im 5/12]
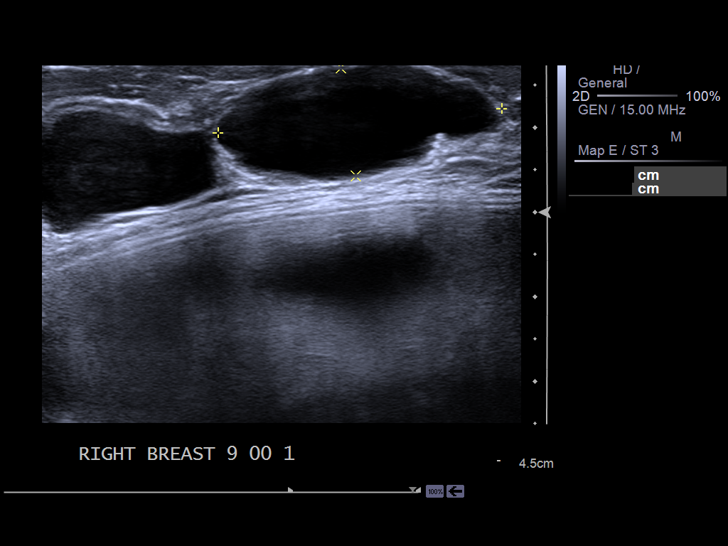
[im 6/12]
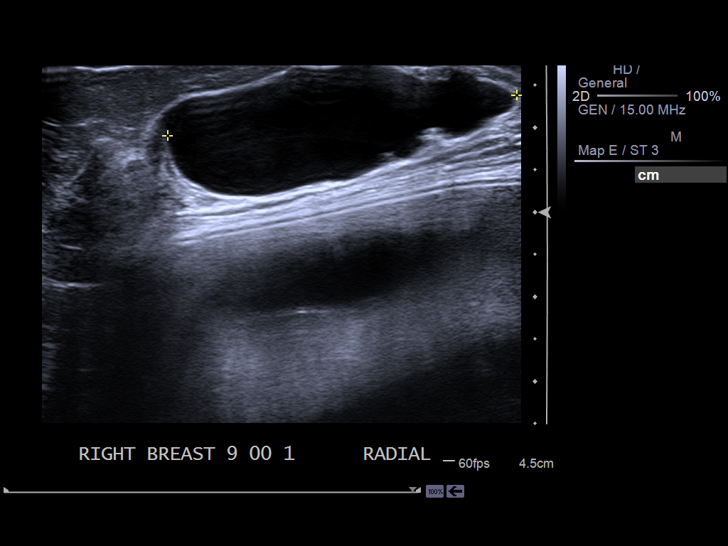
[im 7/12]
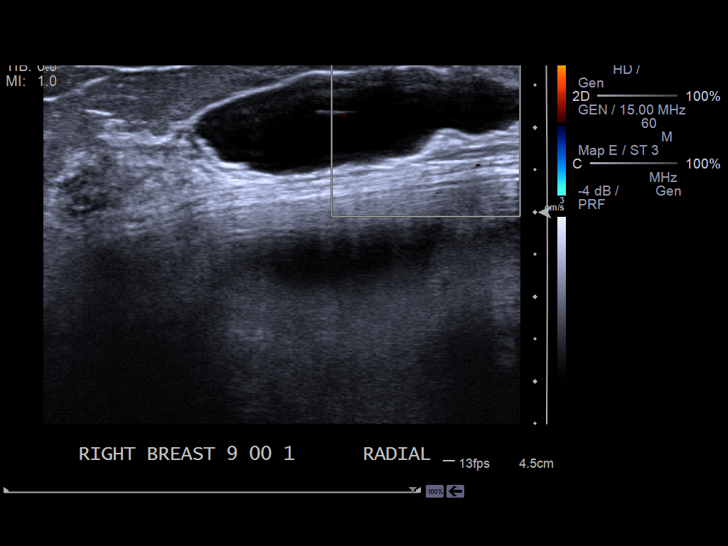
[im 8/12]
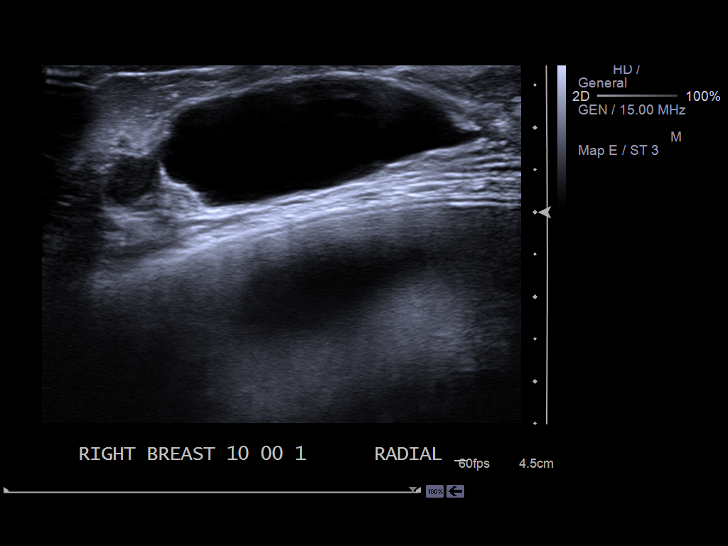
[im 9/12]
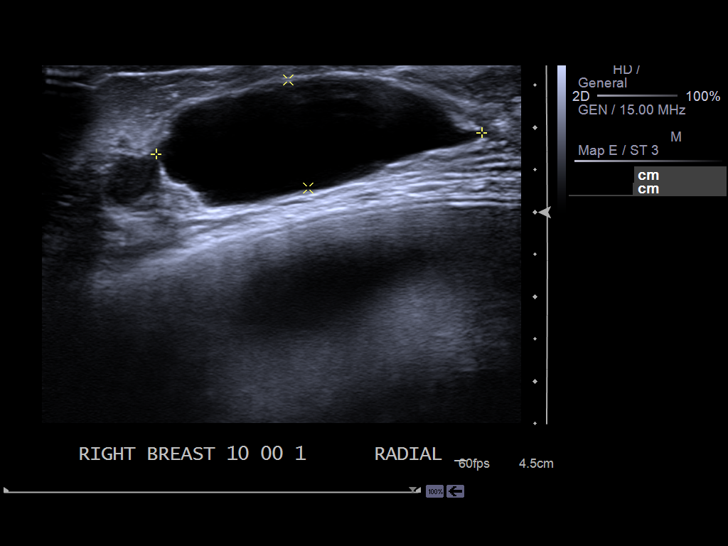
[im 10/12]
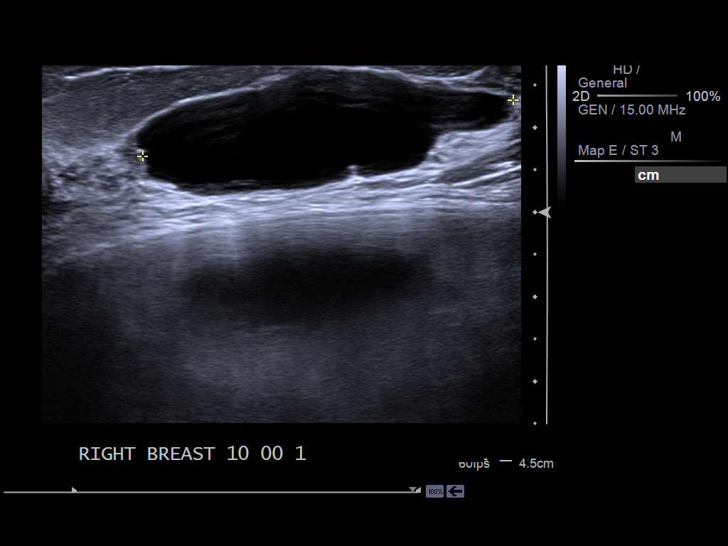
[im 11/12]
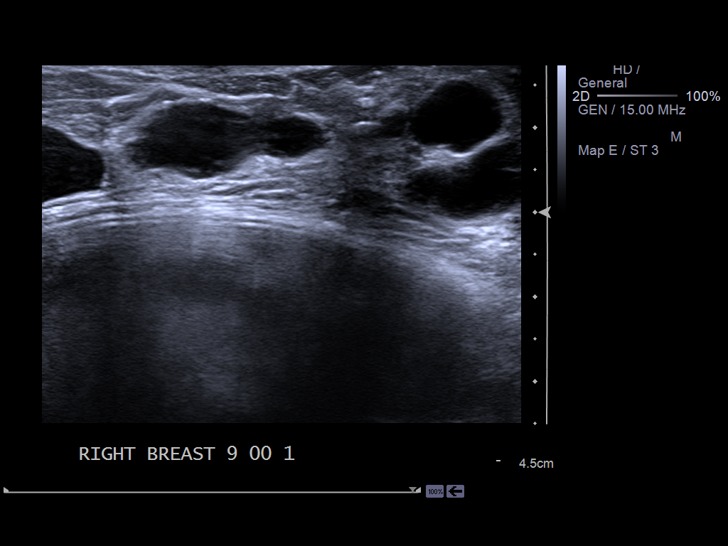
[im 12/12]
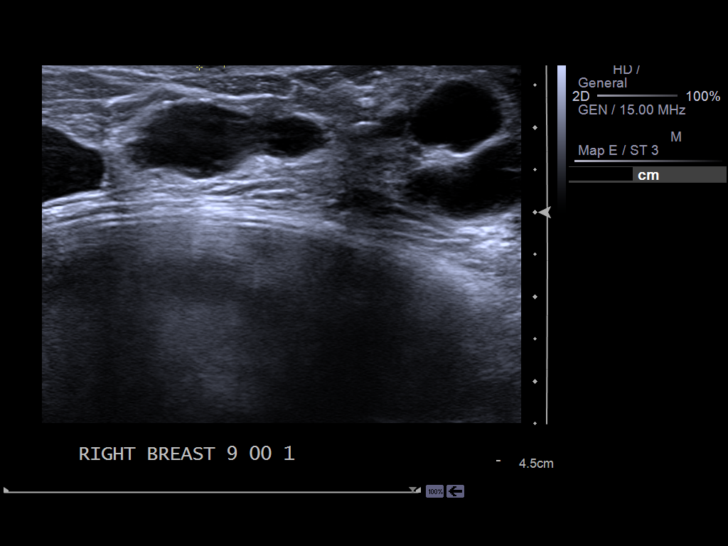

[12 of 12 positions shown; findings below may reference images not displayed]

ACR Breast Density Category d: The breast tissue is extremely dense,
which lowers the sensitivity of mammography.
FINDINGS: The patient presents with a palpable abnormality in the
approximately 6 o'clock position of the subareolar right breast. No
definite suspicious mass is noted mammographically. Scattered
benign-appearing calcifications are noted bilaterally. The left
breast is without suspicious mass, architectural distortion or
malignant type calcifications.

On physical exam, the breast demonstrates multiple palpable for
masses within the subareolar breast. There are three 0.5-1.0 cm
slightly erythematous skin lesions noted in the approximately 6
o'clock, 9 o'clock and 10 o'clock positions of the areola. No
associated drainage or discharge is identified.

Ultrasound is performed, demonstrating multiple cysts within the 6
o'clock, 9 o'clock and 10 o'clock positions of the subareolar right
breast. The largest cyst is noted in the 10 o'clock position of the
subareolar right breast measuring approximately 4.4 x 3.9 x 1.3 cm.
Multiple smaller cysts are present. No definite suspicious mass,
shadowing or fluid collection is identified.

In the 9 o'clock position of the right breast, 1 cm from the nipple
there is a 3 mm parallel hypoechoic lesion likely corresponds to an
intradermal lesion. No additional intradermal lesion is noted in the
6 o'clock or 10 o'clock position of the right breast.

Mammographic images were processed with CAD.
IMPRESSION: Right breast skin lesions may correspond to infected sebaceous cysts
or other intradermal lesion. Multiple subareolar cysts are present.
No definite mammographic or sonographic evidence of malignancy.

RECOMMENDATION:
Recommend clinical follow-up for possible infected sebaceous cysts
or other intradermal lesion. Consider cyst aspirations as clinically
desired or indicated for pain relief. Otherwise, usual annual
screening mammograms are recommended. The patient should return
sooner if clinically indicated.

I have discussed the findings and recommendations with the patient.
Results were also provided in writing at the conclusion of the
visit. If applicable, a reminder letter will be sent to the patient
regarding the next appointment.

BI-RADS CATEGORY  2: Benign.

## 2016-11-04 ENCOUNTER — Telehealth: Payer: Self-pay | Admitting: *Deleted

## 2016-11-04 NOTE — Telephone Encounter (Signed)
Left message for patient to call the office back to reschedule her office visit. She rescheduled her mammogram so office visit needs to be changed.

## 2016-11-10 ENCOUNTER — Ambulatory Visit: Payer: BLUE CROSS/BLUE SHIELD | Admitting: General Surgery

## 2016-11-19 ENCOUNTER — Ambulatory Visit
Admission: RE | Admit: 2016-11-19 | Discharge: 2016-11-19 | Disposition: A | Discharge status: Home / Self Care | Payer: BLUE CROSS/BLUE SHIELD | Source: Ambulatory Visit | Attending: Obstetrics and Gynecology | Admitting: Obstetrics and Gynecology

## 2016-11-19 ENCOUNTER — Other Ambulatory Visit: Payer: Self-pay | Admitting: Obstetrics and Gynecology

## 2016-11-19 DIAGNOSIS — N6322 Unspecified lump in the left breast, upper inner quadrant: Secondary | ICD-10-CM | POA: Diagnosis not present

## 2016-11-19 DIAGNOSIS — N6489 Other specified disorders of breast: Secondary | ICD-10-CM | POA: Diagnosis not present

## 2016-11-19 DIAGNOSIS — R928 Other abnormal and inconclusive findings on diagnostic imaging of breast: Secondary | ICD-10-CM | POA: Diagnosis not present

## 2016-11-19 DIAGNOSIS — R922 Inconclusive mammogram: Secondary | ICD-10-CM | POA: Diagnosis not present

## 2016-11-25 ENCOUNTER — Ambulatory Visit (INDEPENDENT_AMBULATORY_CARE_PROVIDER_SITE_OTHER): Payer: BLUE CROSS/BLUE SHIELD | Admitting: General Surgery

## 2016-11-25 ENCOUNTER — Encounter: Payer: Self-pay | Admitting: General Surgery

## 2016-11-25 VITALS — BP 122/78 | HR 66 | Resp 12 | Ht 68.5 in | Wt 160.0 lb

## 2016-11-25 DIAGNOSIS — D242 Benign neoplasm of left breast: Secondary | ICD-10-CM | POA: Diagnosis not present

## 2016-11-25 NOTE — Patient Instructions (Signed)
The patient is aware to call back for any questions or concerns.  

## 2016-11-25 NOTE — Progress Notes (Signed)
Patient ID: Karen Rodriguez, female   DOB: 09/25/1965, 51 y.o.   MRN: [id number]  Chief Complaint  Patient presents with  . Follow-up    mammogram    HPI Karen Rodriguez is a 51 y.o. female.  who presents for a breast evaluation. The most recent mammogram and left breast ultrasound was done on 11-19-16.  Patient does perform regular self breast checks and gets regular mammograms done.  No new breast issues. She has cut back on caffeine and feels it has helped.   HPI  Past Medical History:  Diagnosis Date  . Anxiety state, unspecified   . Chest pain, unspecified 1997   negative cardiolyte and echo  . Diverticulitis 2009, 2012   dx by CT x 2 (2009, 2012).  never had colonoscopy done.  . Multinodular goiter   . Panic attacks   . Recurrent sinusitis   . Seasonal allergies   . Squamous cell skin cancer   . Subclinical hyperthyroidism 2007  . Teratoma of left ovary 2017   s/p hysterectomy with LSO    Past Surgical History:  Procedure Laterality Date  . AUGMENTATION MAMMAPLASTY Bilateral   . BREAST BIOPSY Left 1996   benign palpable mass excised.  Marland Kitchen BREAST BIOPSY Left 04/15/2016   LEFT BREAST, 11 O'CLOCK, FINE NEEDLE ASPIRATION:  . BREAST CYST ASPIRATION Left 2017  . BREAST ENHANCEMENT SURGERY  1988   Dr. Cathie Hoops; then revised in [facility] ([place])  . cardiolite  1997   normal, negative echo (Dr. Enzo Bi)  . COLONOSCOPY  ?  . CT ABD W & PELVIS WO CM  03/2008   splenomegaly, sigmoid diverticulitis, 2cm dermoid L ovary, constipation  . CT ABD W & PELVIS WO CM  09/2010   acute focal uncomplicated upper sigmoid diverticulosis, dermoid cyst L ovary, constipation  . FINE NEEDLE ASPIRATION Right 11/2012   thyroid nodule - benign follicular cells consistent with colloid nodule Tami Ribas)  . LAPAROSCOPIC VAGINAL HYSTERECTOMY WITH SALPINGO OOPHORECTOMY Bilateral 01/06/2016   LAPAROSCOPIC ASSISTED VAGINAL HYSTERECTOMY WITH BILATERAL SALPINGO OOPHORECTOMY;  Rubie Maid, MD  . nsvd     x2   . tear duct surgery  06/1998   Right  . thyroid uptake scan  2007   early hyperthyroidism, suggestive early toxic adenomas of left lobe ([place])  . thyroid US  2007   benign cyst - R lobe 83mm, 1.1cm mid post left lobe, 1.2 cm mid left lobe  . TONSILLECTOMY      Family History  Problem Relation Age of Onset  . Breast cancer Mother 40  . Hypertension Mother   . Stroke Mother 54  . Other Father        CCY  . Breast cancer Cousin   . Stroke Unknown        GM    Social History Social History  Substance Use Topics  . Smoking status: Never Smoker  . Smokeless tobacco: Never Used  . Alcohol use 1.8 - 2.4 oz/week    3 - 4 Glasses of wine per week     Comment: Glass of wine occasionally    Allergies  Allergen Reactions  . Azithromycin     REACTION: UNSPECIFIED  . Celecoxib     REACTION: SWELLING  . Codeine Rash and Other (See Comments)    Upset stomach    Current Outpatient Prescriptions  Medication Sig Dispense Refill  . valACYclovir (VALTREX) 1000 MG tablet Take 1 tablet (1,000 mg total) by mouth 3 (three) times daily. 21 tablet 0  No current facility-administered medications for this visit.     Review of Systems Review of Systems  Constitutional: Negative.   Respiratory: Negative.   Cardiovascular: Negative.     Blood pressure 122/78, pulse 66, resp. rate 12, height 5' 8.5" (1.74 m), weight 160 lb (72.6 kg), last menstrual period 12/17/2015.  Physical Exam Physical Exam  Constitutional: She is oriented to person, place, and time. She appears well-developed and well-nourished.  HENT:  Mouth/Throat: Oropharynx is clear and moist.  Eyes: Conjunctivae are normal. No scleral icterus.  Neck: Neck supple.  Cardiovascular: Normal rate, regular rhythm and normal heart sounds.   Pulmonary/Chest: Effort normal and breath sounds normal. Right breast exhibits no inverted nipple, no mass, no nipple discharge, no skin change and no tenderness. Left breast exhibits no  inverted nipple, no mass, no nipple discharge, no skin change and no tenderness.    Lymphadenopathy:    She has no cervical adenopathy.    She has no axillary adenopathy.  Neurological: She is alert and oriented to person, place, and time.  Skin: Skin is warm and dry.  Psychiatric: Her behavior is normal.    Data Reviewed Mammogram and left ultrasound of November 19, 2016 reviewed.   Assessment    Stable breast exam.    Plan       Recommendation for f/u ultrasound reviewed with patient.  Offered to have it completed at [place] or in office.  She prefers to have the study completed here.      Follow up left breast ultrasound in 6 months.  HPI, Physical Exam, Assessment and Plan have been scribed under the direction and in the presence of Robert Bellow, MD. Karie Fetch, RN  I have completed the exam and reviewed the above documentation for accuracy and completeness.  I agree with the above.  Haematologist has been used and any errors in dictation or transcription are unintentional.  Hervey Ard, M.D., F.A.C.S.  Robert Bellow 11/26/2016, 9:51 PM

## 2016-11-30 ENCOUNTER — Telehealth: Payer: Self-pay | Admitting: Obstetrics and Gynecology

## 2016-11-30 DIAGNOSIS — N63 Unspecified lump in unspecified breast: Secondary | ICD-10-CM

## 2016-11-30 NOTE — Telephone Encounter (Signed)
LM for pt with Cat 3 mammo results. Need to repeat in 6 months. I'm ok with that since LT breast mass stable. Order in computer. Pt to f/u with any breast changes/questions.

## 2017-02-01 IMAGING — US US SOFT TISSUE HEAD/NECK
1 series · 14 of 25 positions shown · non-contrast
Comparison: None.

CLINICAL DATA: Thyroid nodule.

EXAM:
THYROID ULTRASOUND
TECHNIQUE: Ultrasound examination of the thyroid gland and adjacent soft
tissues was performed.

[Series 1: us soft tissue head/neck · 0.07mm/px · 14 of 89 slices shown]
[im 1/89]
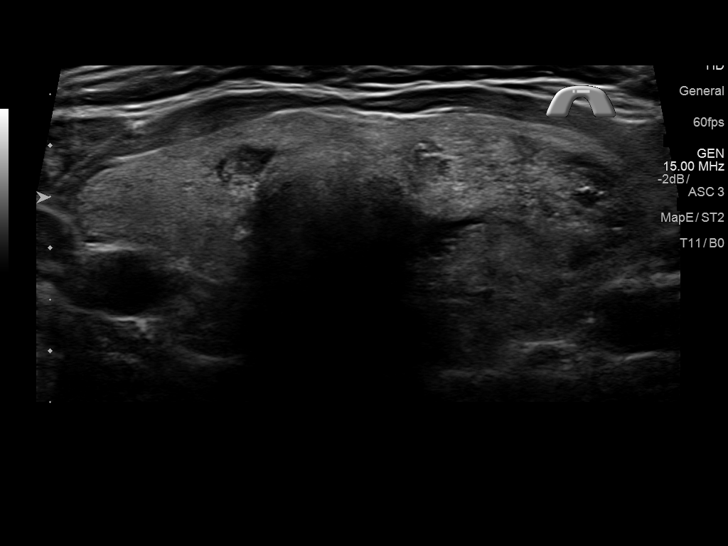
[im 8/89]
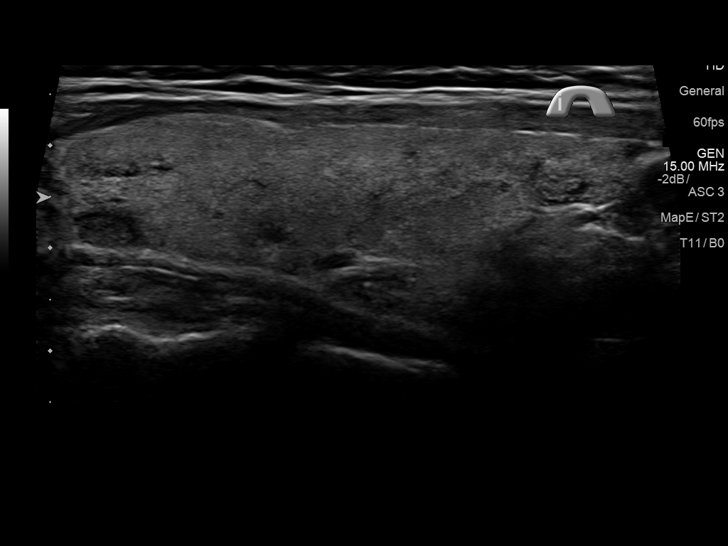
[im 15/89]
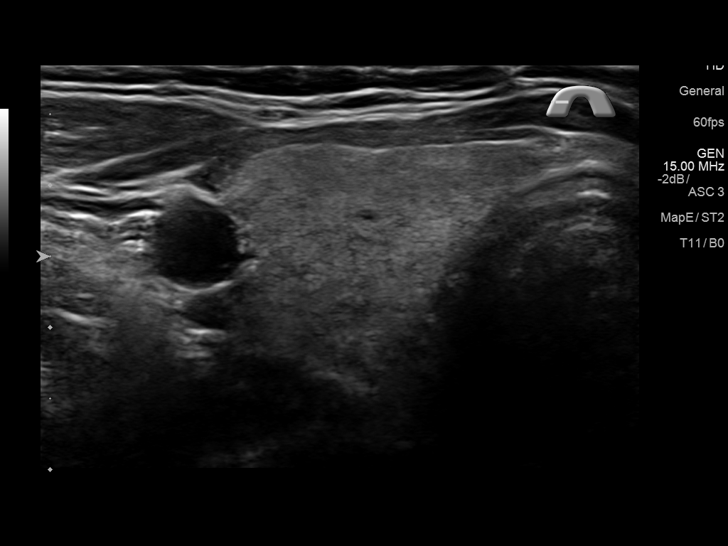
[im 23/89]
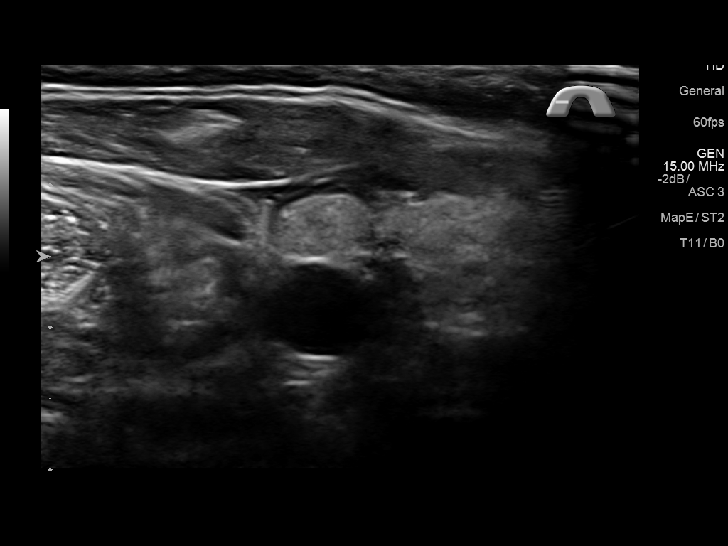
[im 30/89]
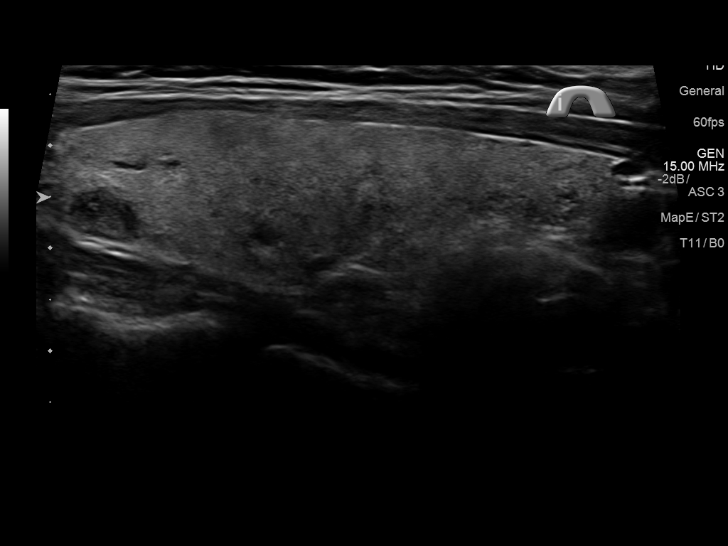
[im 34/89]
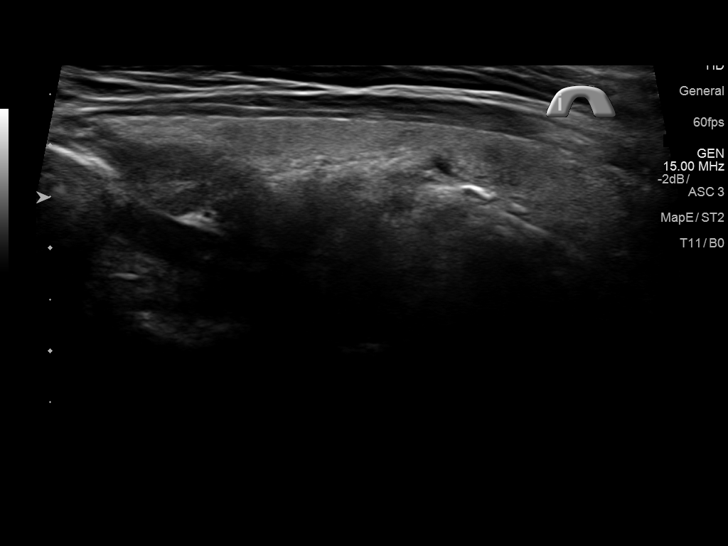
[im 41/89]
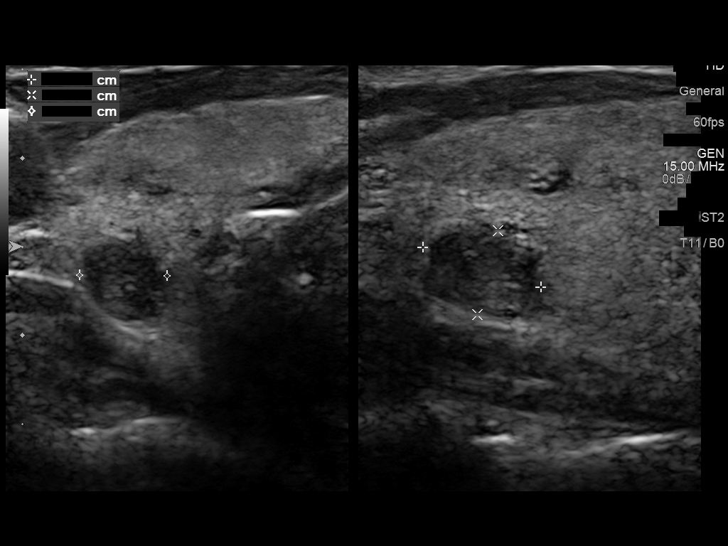
[im 48/89]
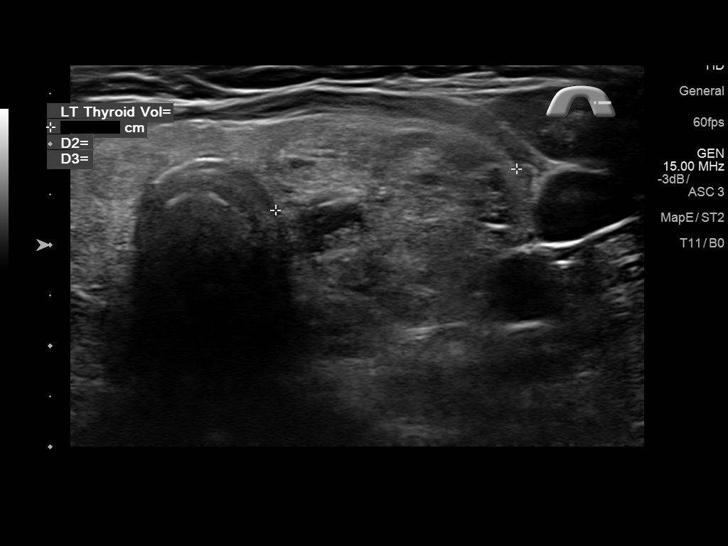
[im 56/89]
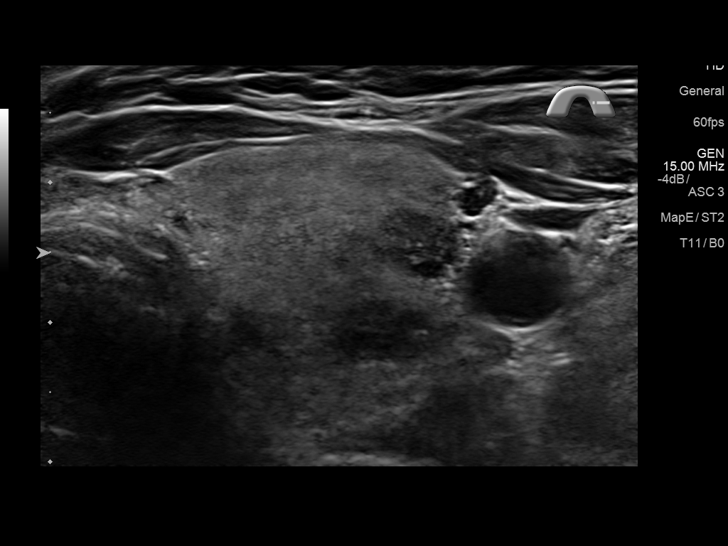
[im 59/89]
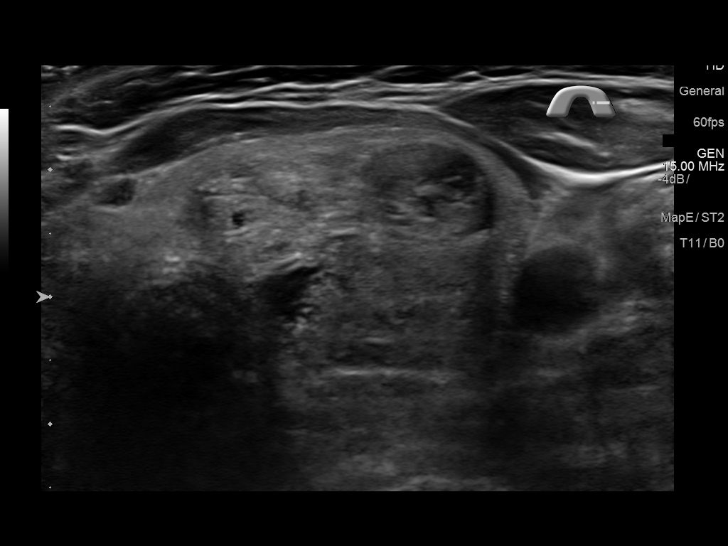
[im 67/89]
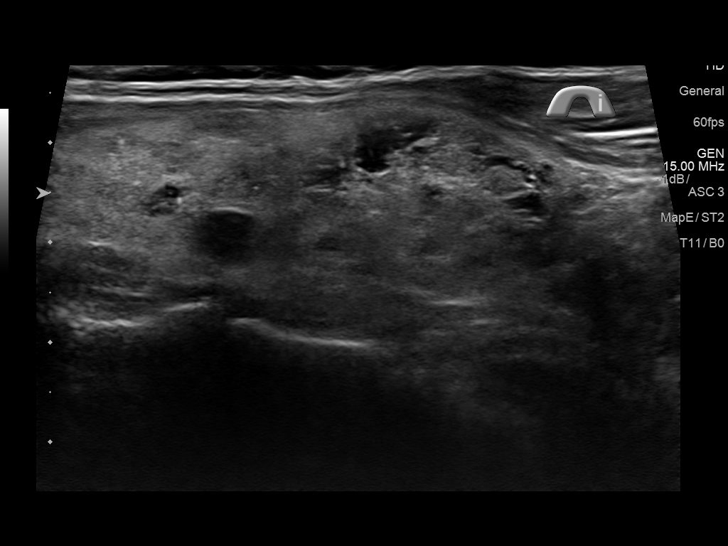
[im 74/89]
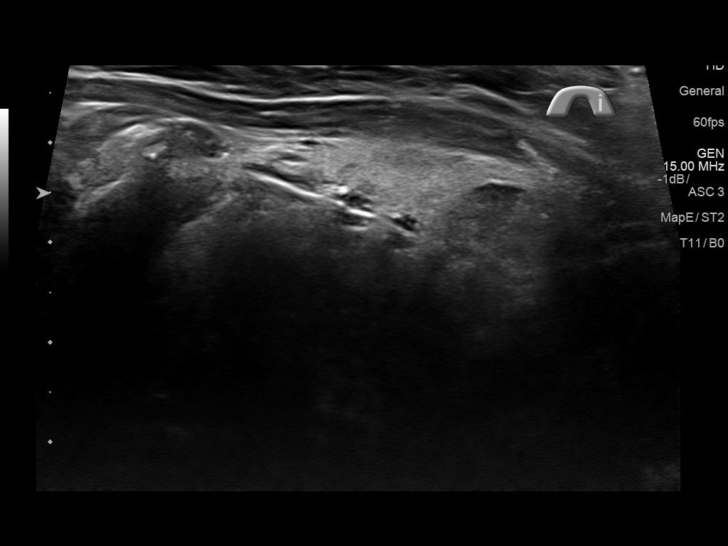
[im 81/89]
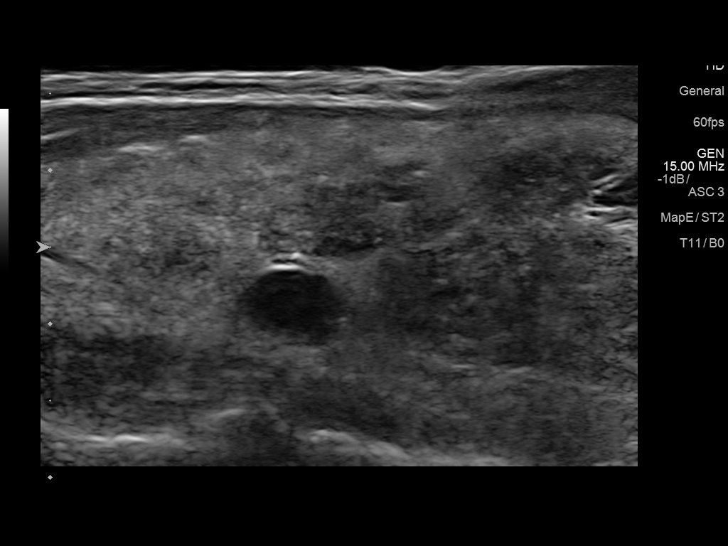
[im 89/89]
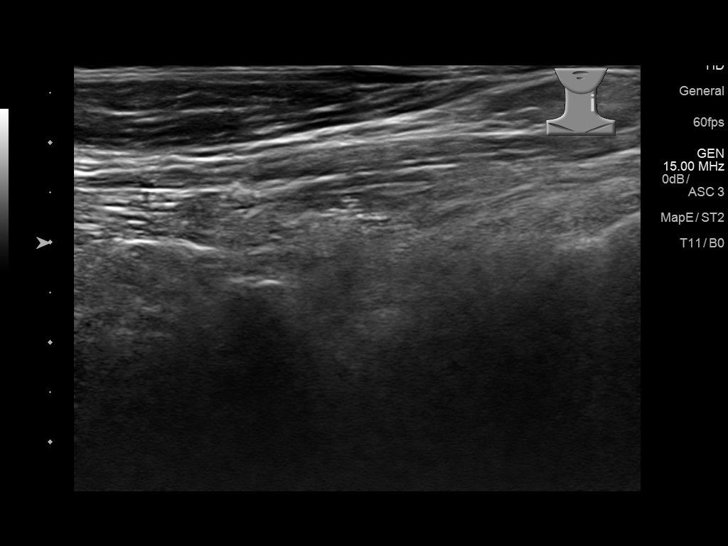

[14 of 25 positions shown; findings below may reference images not displayed]

FINDINGS: Right thyroid lobe

Measurements: 56 x 14 x 19 mm. Mildly heterogeneous background
echotexture. Several small hypoechoic nodules or cysts, largest 7 x
5 x 5 mm in the deep superior pole.

Left thyroid lobe

Measurements: 60 x 22 x 24 mm. Dominant 36 x 18 x 25 mm complex
mostly solid nodule, inferior pole. 8 x 5 x 8 mm hypoechoic nodule,
mid lobe.

Isthmus

Thickness: 2.3 mm. 8 x 4 x 8 mm solid nodule at the junction with
the right lobe

Lymphadenopathy

None visualized.
IMPRESSION: 1. Thyromegaly with bilateral nodules. The dominant left lesion
meets consensus criteria for biopsy. Ultrasound-guided fine needle
aspiration should be considered, as per the consensus statement:
Management of Thyroid Nodules Detected at US: Society of
Radiologists in Ultrasound Consensus Conference Statement. Radiology

## 2017-05-24 ENCOUNTER — Other Ambulatory Visit: Payer: Self-pay

## 2017-05-27 ENCOUNTER — Ambulatory Visit: Payer: BLUE CROSS/BLUE SHIELD | Admitting: General Surgery

## 2017-06-09 ENCOUNTER — Ambulatory Visit
Admission: RE | Admit: 2017-06-09 | Discharge: 2017-06-09 | Disposition: A | Discharge status: Home / Self Care | Payer: BLUE CROSS/BLUE SHIELD | Source: Ambulatory Visit | Attending: Family Medicine | Admitting: Family Medicine

## 2017-06-09 ENCOUNTER — Ambulatory Visit: Payer: BLUE CROSS/BLUE SHIELD | Admitting: Family Medicine

## 2017-06-09 ENCOUNTER — Encounter: Payer: Self-pay | Admitting: Family Medicine

## 2017-06-09 ENCOUNTER — Telehealth: Payer: Self-pay | Admitting: Family Medicine

## 2017-06-09 VITALS — BP 118/72 | HR 99 | Temp 98.7°F | Wt 146.5 lb

## 2017-06-09 DIAGNOSIS — K5732 Diverticulitis of large intestine without perforation or abscess without bleeding: Secondary | ICD-10-CM | POA: Diagnosis not present

## 2017-06-09 DIAGNOSIS — R1032 Left lower quadrant pain: Secondary | ICD-10-CM | POA: Insufficient documentation

## 2017-06-09 DIAGNOSIS — R109 Unspecified abdominal pain: Secondary | ICD-10-CM | POA: Diagnosis not present

## 2017-06-09 DIAGNOSIS — R197 Diarrhea, unspecified: Secondary | ICD-10-CM | POA: Diagnosis not present

## 2017-06-09 LAB — COMPREHENSIVE METABOLIC PANEL
ALT: 10 U/L (ref 0–35)
AST: 12 U/L (ref 0–37)
Albumin: 4.5 g/dL (ref 3.5–5.2)
Alkaline Phosphatase: 65 U/L (ref 39–117)
BUN: 11 mg/dL (ref 6–23)
CHLORIDE: 101 meq/L (ref 96–112)
CO2: 30 meq/L (ref 19–32)
Calcium: 10.1 mg/dL (ref 8.4–10.5)
Creatinine, Ser: 0.56 mg/dL (ref 0.40–1.20)
GFR: 121.02 mL/min (ref >60.00)
GLUCOSE: 123 mg/dL — AB (ref 70–99)
POTASSIUM: 3.6 meq/L (ref 3.5–5.1)
Sodium: 138 mEq/L (ref 135–145)
Total Bilirubin: 1 mg/dL (ref 0.2–1.2)
Total Protein: 7.1 g/dL (ref 6.0–8.3)

## 2017-06-09 LAB — CBC WITH DIFFERENTIAL/PLATELET
Basophils Absolute: 0 10*3/uL (ref 0.0–0.1)
Basophils Relative: 0.2 % (ref 0.0–3.0)
EOS PCT: 0.1 % (ref 0.0–5.0)
Eosinophils Absolute: 0 10*3/uL (ref 0.0–0.7)
HEMATOCRIT: 38.6 % (ref 36.0–46.0)
Hemoglobin: 13.3 g/dL (ref 12.0–15.0)
Lymphocytes Relative: 9.3 % — ABNORMAL LOW (ref 12.0–46.0)
Lymphs Abs: 1 10*3/uL (ref 0.7–4.0)
MCHC: 34.5 g/dL (ref 30.0–36.0)
MCV: 88.6 fl (ref 78.0–100.0)
MONOS PCT: 9 % (ref 3.0–12.0)
Monocytes Absolute: 1 10*3/uL (ref 0.1–1.0)
NEUTROS ABS: 9 10*3/uL — AB (ref 1.4–7.7)
NEUTROS PCT: 81.4 % — AB (ref 43.0–77.0)
PLATELETS: 220 10*3/uL (ref 150.0–400.0)
RBC: 4.36 Mil/uL (ref 3.87–5.11)
RDW: 13.2 % (ref 11.5–15.5)
WBC: 11.1 10*3/uL — ABNORMAL HIGH (ref 4.0–10.5)

## 2017-06-09 LAB — LIPASE: Lipase: 12 U/L (ref 11.0–59.0)

## 2017-06-09 MED ORDER — CIPROFLOXACIN HCL 500 MG PO TABS: 500.0000 mg | ORAL_TABLET | Freq: Two times a day (BID) | ORAL | 0 refills | Status: DC

## 2017-06-09 MED ORDER — IOPAMIDOL (ISOVUE-300) INJECTION 61%
100.0000 mL | Freq: Once | INTRAVENOUS | Status: AC | PRN
  Administered 2017-06-09: 100 mL via INTRAVENOUS

## 2017-06-09 MED ORDER — METRONIDAZOLE 500 MG PO TABS: 500.0000 mg | ORAL_TABLET | Freq: Three times a day (TID) | ORAL | 0 refills | Status: DC

## 2017-06-09 NOTE — Progress Notes (Signed)
BP 118/72 (BP Location: Left Arm, Patient Position: Sitting, Cuff Size: Normal)   Pulse 99   Temp 98.7 F (37.1 C) (Oral)   Wt 146 lb 8 oz (66.5 kg)   LMP 12/17/2015 (Exact Date)   SpO2 98%   BMI 21.95 kg/m    CC: LLQ abd pain Subjective:    Patient ID: Karen Rodriguez, female    DOB: 30-May-1965, 52 y.o.   MRN: [id number]  HPI: Karen Rodriguez is a 52 y.o. female presenting on 06/09/2017 for Abdominal Pain (Located in [place]. Started yesterday. Also, c/o gas and bloating. Has trouble with BMs, took laxative, had BM.  Still having severe sharp pain, worsening. Remembers eating popcorn. H/o diverticulitis)   Bowel changes over last several weeks with increased gassiness. Feels full. Acute worsening LLQ pain last night with nausea, Tmax 99.9. Blood with wiping last few times. Last BM this morning - severe pain, some relief after BM. Feels "pressure/blocked" LLQ. Movement ie walking makes pain worse. This is more pain than prior episodes of divericulitis.   Has tried stool softener laxative.  She has been eating more popcorn recently.  H/o diverticulitis 2009, 2012, she thinks she had remote colonoscopy no records available.  No urinary symptoms.   Relevant past medical, surgical, family and social history reviewed and updated as indicated. Interim medical history since our last visit reviewed. Allergies and medications reviewed and updated. Outpatient Medications Prior to Visit  Medication Sig Dispense Refill  . valACYclovir (VALTREX) 1000 MG tablet Take 1 tablet (1,000 mg total) by mouth 3 (three) times daily. (Patient taking differently: Take 1,000 mg by mouth 3 (three) times daily. Takes as needed) 21 tablet 0   No facility-administered medications prior to visit.      Per HPI unless specifically indicated in ROS section below Review of Systems     Objective:    BP 118/72 (BP Location: Left Arm, Patient Position: Sitting, Cuff Size: Normal)   Pulse 99   Temp 98.7 F (37.1  C) (Oral)   Wt 146 lb 8 oz (66.5 kg)   LMP 12/17/2015 (Exact Date)   SpO2 98%   BMI 21.95 kg/m   Wt Readings from Last 3 Encounters:  06/09/17 146 lb 8 oz (66.5 kg)  11/25/16 160 lb (72.6 kg)  09/23/16 158 lb (71.7 kg)    Physical Exam  Constitutional: She appears well-developed and well-nourished. No distress.  HENT:  Mouth/Throat: Oropharynx is clear and moist. No oropharyngeal exudate.  Cardiovascular: Normal rate, regular rhythm, normal heart sounds and intact distal pulses.  No murmur heard. Pulmonary/Chest: Effort normal and breath sounds normal. No respiratory distress. She has no wheezes. She has no rales.  Abdominal: Soft. Bowel sounds are normal. She exhibits no distension and no mass. There is no hepatosplenomegaly. There is tenderness (moderate) in the right upper quadrant, periumbilical area, suprapubic area and left lower quadrant. There is guarding. There is no rigidity, no rebound, no CVA tenderness and negative Murphy's sign.  Musculoskeletal: She exhibits no edema.  Skin: Skin is warm and dry. No rash noted.  Psychiatric: She has a normal mood and affect.  Nursing note and vitals reviewed.     Assessment & Plan:   Problem List Items Addressed This Visit    Left lower quadrant pain - Primary    Acute severe LLQ pain in h/o diverticulitis concern for recurrence vs complication (perf). No rebound but she does have guarding. Discussed ER eval vs STAT CT with ER precautions. Will  order stat labs, stat CT and she will stay at [facility] until receives notice from [place]. Pt has called husband who will drive her to [facility].       Relevant Orders   CT Abdomen Pelvis W Contrast   Comprehensive metabolic panel   CBC with Differential/Platelet   Lipase       Follow up plan: Return if symptoms worsen or fail to improve.  Ria Bush, MD

## 2017-06-09 NOTE — Telephone Encounter (Signed)
Received call from Dr Vernard Gambles - acute recurrent diverticulitis without abscess or perforation/free air. She did have some extravasation of contrast into arm - they will monitor her for next 2 hours at radiology dept.  I asked rad tech to notify patient to start abx right away when she leaves hospital today - I have sent cipro/flagyl course to pharmacy.  To call us tomorrow with an update. During office visit we discussed clear liquid diet x 1-2 days and advancing as tolerated.

## 2017-06-09 NOTE — Patient Instructions (Signed)
Concern for recurrent diverticulitis, possibly with complications. Stat labs today and stat CT at [facility]. If any worsening, go straight to the hospital. Stay at [facility] until we call you with results.

## 2017-06-09 NOTE — Telephone Encounter (Signed)
Pharmacy list has been updated. 

## 2017-06-09 NOTE — Assessment & Plan Note (Addendum)
Acute severe LLQ pain in h/o diverticulitis concern for recurrence vs complication (perf). No rebound but she does have guarding. Discussed ER eval vs STAT CT with ER precautions. Will order stat labs, stat CT and she will stay at [place] until receives notice from [place]. Pt has called husband who will drive her to [facility].

## 2017-06-09 NOTE — Telephone Encounter (Signed)
Pt is requesting an update to pharmacy. New pharmacy- [facility] in [place]

## 2017-06-10 ENCOUNTER — Telehealth: Payer: Self-pay | Admitting: General Surgery

## 2017-06-10 NOTE — Progress Notes (Signed)
Patient had extravasation of contrast from her CT scan in her arm yesterday at [facility].  Protocol was followed.  She kept her arm elevated overnight and has had some ice on it as well.  She states it feels a little better today and is slightly less swollen.  It is not worse.  She has no blistering or discoloration of the skin.  I recommended continued intermittent elevation of her arm as well as ice therapy.  She is told to call the radiology department back if she develops worsening pain, swelling, discoloration of her skin, etc.  She understands and is agreeable with the plan.  Karen Rodriguez 11:38 AM 06/10/2017

## 2017-06-17 ENCOUNTER — Encounter: Payer: Self-pay | Admitting: *Deleted

## 2017-06-29 ENCOUNTER — Other Ambulatory Visit: Payer: Self-pay

## 2017-06-29 ENCOUNTER — Emergency Department
Admission: EM | Admit: 2017-06-29 | Discharge: 2017-06-30 | Disposition: A | Discharge status: Home / Self Care | Payer: BLUE CROSS/BLUE SHIELD | Attending: Emergency Medicine | Admitting: Emergency Medicine

## 2017-06-29 ENCOUNTER — Encounter: Payer: Self-pay | Admitting: Emergency Medicine

## 2017-06-29 DIAGNOSIS — R1013 Epigastric pain: Secondary | ICD-10-CM

## 2017-06-29 DIAGNOSIS — R109 Unspecified abdominal pain: Secondary | ICD-10-CM | POA: Diagnosis not present

## 2017-06-29 MED ORDER — ONDANSETRON HCL 4 MG/2ML IJ SOLN
4.0000 mg | Freq: Once | INTRAMUSCULAR | Status: AC
  Administered 2017-06-29: 4 mg via INTRAVENOUS

## 2017-06-29 MED ORDER — MORPHINE SULFATE (PF) 2 MG/ML IV SOLN: 2.0000 mg | Freq: Once | INTRAVENOUS | Status: DC

## 2017-06-29 MED ORDER — MORPHINE SULFATE (PF) 4 MG/ML IV SOLN
INTRAVENOUS | Status: AC
  Administered 2017-06-29: 2 mg
  Filled 2017-06-29: qty 1

## 2017-06-29 MED ORDER — MORPHINE SULFATE (PF) 4 MG/ML IV SOLN
INTRAVENOUS | Status: DC
Start: 2017-06-29 — End: 2017-06-30
  Filled 2017-06-29: qty 1

## 2017-06-29 MED ORDER — ONDANSETRON HCL 4 MG/2ML IJ SOLN
INTRAMUSCULAR | Status: AC
  Administered 2017-06-29: 4 mg via INTRAVENOUS
  Filled 2017-06-29: qty 2

## 2017-06-29 NOTE — ED Triage Notes (Signed)
pt presents to ED with since 0530 this morning. Seen in this ED for 2-3 weeks ago with lower abd pain and dx with diverticulitis. Pt tearful and states this time her pain is located in the epigastric region of her abd. Tender to touch and very painful. pt states pain increases "even when my clothes or my purse to touch it". Took otc medication for GERD with no relief. Food makes symptoms worse. Denies n/v/d.

## 2017-06-29 NOTE — ED Provider Notes (Signed)
[facility] Emergency Department Provider Note    First MD Initiated Contact with Patient 06/29/17 2301     (approximate)  I have reviewed the triage vital signs and the nursing notes.   HISTORY  Chief Complaint Abdominal Pain   HPI Karen Rodriguez is a 52 y.o. female with below list of chronic medical conditions including recently diagnosed diverticulitis on 06/09/2017 presents to the emergency department with acute onset of burning 10 out of 10 supraumbilical /epigastric  abdominal pain which began at 5:30 AM this morning.  Patient states that the area is very tender to touch.  Patient denies any vomiting no diarrhea.  Patient denies any consultation.  Patient denies any urinary symptoms..   Past Medical History:  Diagnosis Date  . Anxiety state, unspecified   . Chest pain, unspecified 1997   negative cardiolyte and echo  . Diverticulitis 2009, 2012   dx by CT x 2 (2009, 2012).  never had colonoscopy done.  . Multinodular goiter   . Panic attacks   . Recurrent sinusitis   . Seasonal allergies   . Squamous cell skin cancer   . Subclinical hyperthyroidism 2007  . Teratoma of left ovary 2017   s/p hysterectomy with LSO    Patient Active Problem List   Diagnosis Date Noted  . Neuropathic pain 09/23/2016  . Sprain and strain of ribs 12/19/2015  . Family history of breast cancer 11/15/2015  . History of breast lump 11/15/2015  . Fibroadenoma of breast, left 10/07/2015  . Breast cyst, right 06/20/2014  . Thyroid enlargement 01/04/2013  . Left lower quadrant pain 10/15/2010  . ANXIETY 09/01/2007  . CHEST PAIN 09/01/2007    Past Surgical History:  Procedure Laterality Date  . ABDOMINAL HYSTERECTOMY    . AUGMENTATION MAMMAPLASTY Bilateral   . BREAST BIOPSY Left 1996   benign palpable mass excised.  Marland Kitchen BREAST BIOPSY Left 04/15/2016   LEFT BREAST, 11 O'CLOCK, FINE NEEDLE ASPIRATION:  . BREAST CYST ASPIRATION Left 2017  . BREAST ENHANCEMENT  SURGERY  1988   Dr. Cathie Hoops; then revised in [facility] ([place])  . cardiolite  1997   normal, negative echo (Dr. Enzo Bi)  . COLONOSCOPY  ?  . CT ABD W & PELVIS WO CM  03/2008   splenomegaly, sigmoid diverticulitis, 2cm dermoid L ovary, constipation  . CT ABD W & PELVIS WO CM  09/2010   acute focal uncomplicated upper sigmoid diverticulosis, dermoid cyst L ovary, constipation  . FINE NEEDLE ASPIRATION Right 11/2012   thyroid nodule - benign follicular cells consistent with colloid nodule Tami Ribas)  . LAPAROSCOPIC VAGINAL HYSTERECTOMY WITH SALPINGO OOPHORECTOMY Bilateral 01/06/2016   LAPAROSCOPIC ASSISTED VAGINAL HYSTERECTOMY WITH BILATERAL SALPINGO OOPHORECTOMY;  Rubie Maid, MD  . nsvd     x2  . tear duct surgery  06/1998   Right  . thyroid uptake scan  2007   early hyperthyroidism, suggestive early toxic adenomas of left lobe ([place])  . thyroid US  2007   benign cyst - R lobe 75mm, 1.1cm mid post left lobe, 1.2 cm mid left lobe  . TONSILLECTOMY      Prior to Admission medications   Medication Sig Start Date End Date Taking? Authorizing Provider  ciprofloxacin (CIPRO) 500 MG tablet Take 1 tablet (500 mg total) by mouth 2 (two) times daily. 06/09/17   Ria Bush, MD  metroNIDAZOLE (FLAGYL) 500 MG tablet Take 1 tablet (500 mg total) by mouth 3 (three) times daily. 06/09/17   Ria Bush, MD  pantoprazole (PROTONIX) 40 MG tablet Take 1 tablet (40 mg total) by mouth daily. 06/30/17 07/30/17  Gregor Hams, MD  valACYclovir (VALTREX) 1000 MG tablet Take 1 tablet (1,000 mg total) by mouth 3 (three) times daily. Patient taking differently: Take 1,000 mg by mouth 3 (three) times daily. Takes as needed 09/23/16   Lucille Passy, MD    Allergies Azithromycin; Celecoxib; and Codeine  Family History  Problem Relation Age of Onset  . Breast cancer Mother 44  . Hypertension Mother   . Stroke Mother 4  . Other Father        CCY  . Breast cancer Cousin   . Stroke Unknown         GM    Social History Social History   Tobacco Use  . Smoking status: Never Smoker  . Smokeless tobacco: Never Used  Substance Use Topics  . Alcohol use: Yes    Alcohol/week: 1.8 - 2.4 oz    Types: 3 - 4 Glasses of wine per week    Comment: Glass of wine occasionally  . Drug use: No    Review of Systems Constitutional: No fever/chills Eyes: No visual changes. ENT: No sore throat. Cardiovascular: Denies chest pain. Respiratory: Denies shortness of breath. Gastrointestinal: Positive for abdominal pain.  No nausea, no vomiting.  No diarrhea.  No constipation. Genitourinary: Negative for dysuria. Musculoskeletal: Negative for neck pain.  Negative for back pain. Integumentary: Negative for rash. Neurological: Negative for headaches, focal weakness or numbness.  ____________________________________________   PHYSICAL EXAM:  VITAL SIGNS: ED Triage Vitals  Enc Vitals Group     BP 06/29/17 2257 (!) 151/85     Pulse Rate 06/29/17 2257 90     Resp 06/29/17 2257 20     Temp 06/29/17 2257 98.2 F (36.8 C)     Temp Source 06/29/17 2257 Oral     SpO2 06/29/17 2257 99 %     Weight 06/29/17 2258 65.8 kg (145 lb)     Height 06/29/17 2258 1.727 m (5\' 8" )     Head Circumference --      Peak Flow --      Pain Score 06/29/17 2257 8     Pain Loc --      Pain Edu? --      Excl. in [place]? --     Constitutional: Alert and oriented.  Apparent discomfort  eyes: Conjunctivae are normal.  Head: Atraumatic. Mouth/Throat: Mucous membranes are moist.  Oropharynx non-erythematous. Neck: No stridor.   Cardiovascular: Normal rate, regular rhythm. Good peripheral circulation. Grossly normal heart sounds. Respiratory: Normal respiratory effort.  No retractions. Lungs CTAB. Gastrointestinal: Generalized tenderness to palpation. No distention.  Musculoskeletal: No lower extremity tenderness nor edema. No gross deformities of extremities. Neurologic:  Normal speech and language. No gross focal  neurologic deficits are appreciated.  Skin:  Skin is warm, dry and intact. No rash noted. Psychiatric: Mood and affect are normal. Speech and behavior are normal.  ____________________________________________   LABS (all labs ordered are listed, but only abnormal results are displayed)  Labs Reviewed  COMPREHENSIVE METABOLIC PANEL - Abnormal; Notable for the following components:      Result Value   Glucose, Bld 113 (*)    All other components within normal limits  URINALYSIS, COMPLETE (UACMP) WITH MICROSCOPIC - Abnormal; Notable for the following components:   Color, Urine YELLOW (*)    APPearance CLEAR (*)    Specific Gravity, Urine >1.046 (*)    Hgb urine  dipstick SMALL (*)    Squamous Epithelial / LPF 0-5 (*)    All other components within normal limits  LIPASE, BLOOD  CBC  TROPONIN I   ____________________________________________  EKG  ED ECG REPORT I, [place] Derk Doubek, the attending physician, personally viewed and interpreted this ECG.   Date: 06/30/2017  EKG Time: 11:10 PM  Rate: 75  Rhythm: Normal sinus rhythm  Axis: Normal  Intervals: Normal  ST&T Change: None  ____________________________________________  RADIOLOGY I, [place] N Kaenan Jake, personally viewed and evaluated these images (plain radiographs) as part of my medical decision making, as well as reviewing the written report by the radiologist.    Official radiology report(s): Ct Abdomen Pelvis W Contrast  Result Date: 06/30/2017 CLINICAL DATA:  Lower abdominal pain for 3 weeks, periumbilical pain. Diagnosed with diverticulitis 3 weeks ago. History of hysterectomy. EXAM: CT ABDOMEN AND PELVIS WITH CONTRAST TECHNIQUE: Multidetector CT imaging of the abdomen and pelvis was performed using the standard protocol following bolus administration of intravenous contrast. CONTRAST:  19mL ISOVUE-300 IOPAMIDOL (ISOVUE-300) INJECTION 61% COMPARISON:  CT abdomen and pelvis June 09, 2017 FINDINGS: LOWER CHEST:  Lung bases are clear. Included heart size is normal. No pericardial effusion. Bilateral breast implants. Mild gas distended distal esophagus associated with reflux. HEPATOBILIARY: Liver and gallbladder are normal. PANCREAS: Normal. SPLEEN: Normal. ADRENALS/URINARY TRACT: Kidneys are orthotopic, demonstrating symmetric enhancement. No nephrolithiasis, hydronephrosis or solid renal masses. The unopacified ureters are normal in course and caliber. Delayed imaging through the kidneys demonstrates symmetric prompt contrast excretion within the proximal urinary collecting system. Urinary bladder is partially distended and unremarkable. Normal adrenal glands. STOMACH/BOWEL: The stomach, small and large bowel are normal in course and caliber without inflammatory changes, sensitivity decreased without oral contrast. Severe descending and sigmoid colonic diverticulosis, interval resolution of diverticulitis. Moderate volume retained large bowel stool. Normal appendix. VASCULAR/LYMPHATIC: Aortoiliac vessels are normal in course and caliber. No lymphadenopathy by CT size criteria. REPRODUCTIVE: Status post hysterectomy. OTHER: No intraperitoneal free fluid or free air. MUSCULOSKELETAL: Nonacute.  Anterior abdominal wall scarring. IMPRESSION: 1. Moderate retained large bowel stool without bowel obstruction or acute intra-abdominal/pelvic process. 2. Interval resolution of sigmoid diverticulitis. Electronically Signed   By: Elon Alas M.D.   On: 06/30/2017 00:56      Procedures   ____________________________________________   INITIAL IMPRESSION / ASSESSMENT AND PLAN / ED COURSE  As part of my medical decision making, I reviewed the following data within the electronic MEDICAL RECORD [id number] year old female presented with above-stated history and physical exam secondary to abdominal pain.  Given recent diverticulitis diagnosis concern for possible diverticular disease complications such as abscess perforation  however none was noted on CT scan of the abdomen and the CT revealed no explanation for the patient's abdominal pain.  Given location and nature of the pain (burning) suspect possible gastritis versus ulcer disease patient given a GI cocktail with improvement of pain and as such supporting this possibility.  Patient will be prescribed Protonix for home and referred to gastroenterology for further outpatient evaluation and management.  ____________________________________________  FINAL CLINICAL IMPRESSION(S) / ED DIAGNOSES  Final diagnoses:  Epigastric pain     MEDICATIONS GIVEN DURING THIS VISIT:  Medications  morphine 2 MG/ML injection 2 mg (2 mg Intravenous Not Given 06/29/17 2345)  morphine 4 MG/ML injection (2 mg  Given 06/29/17 2333)  ondansetron (ZOFRAN) injection 4 mg (4 mg Intravenous Given 06/29/17 2333)  morphine 4 MG/ML injection 4 mg (4 mg Intravenous Given 06/30/17 0004)  iopamidol (ISOVUE-300)  61 % injection 100 mL (100 mLs Intravenous Contrast Given 06/30/17 0029)  gi cocktail (Maalox,Lidocaine,Donnatal) (30 mLs Oral Given 06/30/17 0151)     ED Discharge Orders        Ordered    pantoprazole (PROTONIX) 40 MG tablet  Daily     06/30/17 0236       Note:  This document was prepared using Dragon voice recognition software and may include unintentional dictation errors.    Gregor Hams, MD 06/30/17 [phone]

## 2017-06-30 ENCOUNTER — Encounter: Payer: Self-pay | Admitting: Radiology

## 2017-06-30 ENCOUNTER — Emergency Department: Payer: BLUE CROSS/BLUE SHIELD

## 2017-06-30 DIAGNOSIS — R109 Unspecified abdominal pain: Secondary | ICD-10-CM | POA: Diagnosis not present

## 2017-06-30 LAB — COMPREHENSIVE METABOLIC PANEL
ALBUMIN: 4.5 g/dL (ref 3.5–5.0)
ALK PHOS: 59 U/L (ref 38–126)
ALT: 15 U/L (ref 14–54)
AST: 22 U/L (ref 15–41)
Anion gap: 7 (ref 5–15)
BILIRUBIN TOTAL: 1 mg/dL (ref 0.3–1.2)
BUN: 14 mg/dL (ref 6–20)
CO2: 26 mmol/L (ref 22–32)
CREATININE: 0.63 mg/dL (ref 0.44–1.00)
Calcium: 9.7 mg/dL (ref 8.9–10.3)
Chloride: 108 mmol/L (ref 101–111)
GFR calc Af Amer: >60 mL/min (ref >60)
GFR calc non Af Amer: >60 mL/min (ref >60)
GLUCOSE: 113 mg/dL — AB (ref 65–99)
Potassium: 3.5 mmol/L (ref 3.5–5.1)
Sodium: 141 mmol/L (ref 135–145)
Total Protein: 7.2 g/dL (ref 6.5–8.1)

## 2017-06-30 LAB — CBC
HCT: 41.4 % (ref 35.0–47.0)
Hemoglobin: 14.1 g/dL (ref 12.0–16.0)
MCH: 29.9 pg (ref 26.0–34.0)
MCHC: 34.1 g/dL (ref 32.0–36.0)
MCV: 87.8 fL (ref 80.0–100.0)
PLATELETS: 253 10*3/uL (ref 150–440)
RBC: 4.71 MIL/uL (ref 3.80–5.20)
RDW: 13.6 % (ref 11.5–14.5)
WBC: 5.2 10*3/uL (ref 3.6–11.0)

## 2017-06-30 LAB — URINALYSIS, COMPLETE (UACMP) WITH MICROSCOPIC
BACTERIA UA: NONE SEEN
BILIRUBIN URINE: NEGATIVE
Glucose, UA: NEGATIVE mg/dL
Ketones, ur: NEGATIVE mg/dL
Leukocytes, UA: NEGATIVE
NITRITE: NEGATIVE
PH: 6 (ref 5.0–8.0)
Protein, ur: NEGATIVE mg/dL

## 2017-06-30 LAB — TROPONIN I: Troponin I: <0.03 ng/mL (ref <0.03)

## 2017-06-30 LAB — LIPASE, BLOOD: Lipase: 29 U/L (ref 11–51)

## 2017-06-30 MED ORDER — GI COCKTAIL ~~LOC~~
30.0000 mL | Freq: Once | ORAL | Status: AC
  Administered 2017-06-30: 30 mL via ORAL
  Filled 2017-06-30: qty 30

## 2017-06-30 MED ORDER — IOPAMIDOL (ISOVUE-300) INJECTION 61%
100.0000 mL | Freq: Once | INTRAVENOUS | Status: AC | PRN
  Administered 2017-06-30: 100 mL via INTRAVENOUS

## 2017-06-30 MED ORDER — MORPHINE SULFATE (PF) 4 MG/ML IV SOLN
4.0000 mg | Freq: Once | INTRAVENOUS | Status: AC
  Administered 2017-06-30: 4 mg via INTRAVENOUS

## 2017-06-30 MED ORDER — PANTOPRAZOLE SODIUM 40 MG PO TBEC: 40.0000 mg | DELAYED_RELEASE_TABLET | Freq: Every day | ORAL | 0 refills | Status: DC

## 2017-06-30 NOTE — ED Notes (Signed)
Pt says she is feeling better, reports pain 4/10, states that the burning sensation she was experiencing has much improved. Notified EDP of the same.

## 2017-06-30 NOTE — ED Notes (Signed)
ED Provider at bedside. 

## 2017-06-30 NOTE — ED Notes (Signed)
Patient transported to CT 

## 2017-07-06 ENCOUNTER — Other Ambulatory Visit: Payer: Self-pay

## 2017-07-06 ENCOUNTER — Ambulatory Visit: Payer: BLUE CROSS/BLUE SHIELD | Admitting: Gastroenterology

## 2017-07-06 ENCOUNTER — Encounter: Payer: Self-pay | Admitting: Gastroenterology

## 2017-07-06 VITALS — BP 135/89 | HR 73 | Ht 68.5 in | Wt 147.2 lb

## 2017-07-06 DIAGNOSIS — R1013 Epigastric pain: Secondary | ICD-10-CM | POA: Diagnosis not present

## 2017-07-06 NOTE — Progress Notes (Signed)
Gastroenterology Consultation  Referring Provider:     Ria Bush, MD Primary Care Physician:  Ria Bush, MD Primary Gastroenterologist:  Dr. Allen Norris     Reason for Consultation:     Abdominal pain        HPI:   Karen Rodriguez is a 52 y.o. y/o female referred for consultation & management of Abdominal pain by Dr. Ria Bush, MD.  This patient comes in today with a history of diverticulitis and abdominal pain.  The patient had left-sided abdominal pain and was seen in January of this year with a finding on CT scan showing diverticulitis.  The patient was started on antibiotics and the pain improved.  The patient then presented last week to the ER again with severe abdominal pain.  The patient reports the pain to be in the epigastric area and is not consistent with her previous diverticulitis pain.  The patient has never had a colonoscopy either.  The patient was seen in the ER for the epigastric pain and a repeat CT scan did not show any diverticulitis or other cause for her pain.  The patient was given a GI cocktail and sent home with Protonix.  The patient states that the pain only lasted one day and then resolved.  She does report that the pain was something she woke up with and lasted throughout the day.  She denies it being exacerbated by any certain foods she ate but she does report that she was nauseated and unable to eat when the pain was present.  The CT scan also showed her to have a large amount of stool in her colon.  The patient was put on MiraLAX with only minimal results over the last few days.  Past Medical History:  Diagnosis Date  . Anxiety state, unspecified   . Chest pain, unspecified 1997   negative cardiolyte and echo  . Diverticulitis 2009, 2012   dx by CT x 2 (2009, 2012).  never had colonoscopy done.  . Multinodular goiter   . Panic attacks   . Recurrent sinusitis   . Seasonal allergies   . Squamous cell skin cancer   . Subclinical  hyperthyroidism 2007  . Teratoma of left ovary 2017   s/p hysterectomy with LSO    Past Surgical History:  Procedure Laterality Date  . ABDOMINAL HYSTERECTOMY    . AUGMENTATION MAMMAPLASTY Bilateral   . BREAST BIOPSY Left 1996   benign palpable mass excised.  Marland Kitchen BREAST BIOPSY Left 04/15/2016   LEFT BREAST, 11 O'CLOCK, FINE NEEDLE ASPIRATION:  . BREAST CYST ASPIRATION Left 2017  . BREAST ENHANCEMENT SURGERY  1988   Dr. Cathie Hoops; then revised in [employer] ([place])  . cardiolite  1997   normal, negative echo (Dr. Enzo Bi)  . COLONOSCOPY  ?  . CT ABD W & PELVIS WO CM  03/2008   splenomegaly, sigmoid diverticulitis, 2cm dermoid L ovary, constipation  . CT ABD W & PELVIS WO CM  09/2010   acute focal uncomplicated upper sigmoid diverticulosis, dermoid cyst L ovary, constipation  . FINE NEEDLE ASPIRATION Right 11/2012   thyroid nodule - benign follicular cells consistent with colloid nodule Tami Ribas)  . LAPAROSCOPIC VAGINAL HYSTERECTOMY WITH SALPINGO OOPHORECTOMY Bilateral 01/06/2016   LAPAROSCOPIC ASSISTED VAGINAL HYSTERECTOMY WITH BILATERAL SALPINGO OOPHORECTOMY;  Rubie Maid, MD  . nsvd     x2  . tear duct surgery  06/1998   Right  . thyroid uptake scan  2007   early hyperthyroidism, suggestive early toxic adenomas  of left lobe ([place])  . thyroid US  2007   benign cyst - R lobe 47mm, 1.1cm mid post left lobe, 1.2 cm mid left lobe  . TONSILLECTOMY      Prior to Admission medications   Medication Sig Start Date End Date Taking? Authorizing Provider  pantoprazole (PROTONIX) 40 MG tablet Take 1 tablet (40 mg total) by mouth daily. 06/30/17 07/30/17 Yes Gregor Hams, MD  valACYclovir (VALTREX) 1000 MG tablet Take 1 tablet (1,000 mg total) by mouth 3 (three) times daily. Patient taking differently: Take 1,000 mg by mouth 3 (three) times daily. Takes as needed 09/23/16  Yes Lucille Passy, MD  ciprofloxacin (CIPRO) 500 MG tablet Take 1 tablet (500 mg total) by mouth 2 (two) times  daily. Patient not taking: Reported on 07/06/2017 06/09/17   Ria Bush, MD  metroNIDAZOLE (FLAGYL) 500 MG tablet Take 1 tablet (500 mg total) by mouth 3 (three) times daily. Patient not taking: Reported on 07/06/2017 06/09/17   Ria Bush, MD    Family History  Problem Relation Age of Onset  . Breast cancer Mother 10  . Hypertension Mother   . Stroke Mother 30  . Other Father        CCY  . Breast cancer Cousin   . Stroke Unknown        GM     Social History   Tobacco Use  . Smoking status: Never Smoker  . Smokeless tobacco: Never Used  Substance Use Topics  . Alcohol use: Yes    Alcohol/week: 1.8 - 2.4 oz    Types: 3 - 4 Glasses of wine per week    Comment: Glass of wine occasionally  . Drug use: No    Allergies as of 07/06/2017 - Review Complete 07/06/2017  Allergen Reaction Noted  . Azithromycin  03/29/2007  . Celecoxib  03/29/2007  . Codeine Rash and Other (See Comments) 06/20/2014    Review of Systems:    All systems reviewed and negative except where noted in HPI.   Physical Exam:  BP 135/89   Pulse 73   Ht 5' 8.5" (1.74 m)   Wt 147 lb 3.2 oz (66.8 kg)   LMP 12/17/2015 (Exact Date)   BMI 22.06 kg/m  Patient's last menstrual period was 12/17/2015 (exact date). Psych:  Alert and cooperative. Normal mood and affect. General:   Alert,  Well-developed, well-nourished, pleasant and cooperative in NAD Head:  Normocephalic and atraumatic. Eyes:  Sclera clear, no icterus.   Conjunctiva pink. Ears:  Normal auditory acuity. Nose:  No deformity, discharge, or lesions. Mouth:  No deformity or lesions,oropharynx pink & moist. Neck:  Supple; no masses or thyromegaly. Lungs:  Respirations even and unlabored.  Clear throughout to auscultation.   No wheezes, crackles, or rhonchi. No acute distress. Heart:  Regular rate and rhythm; no murmurs, clicks, rubs, or gallops. Abdomen:  Normal bowel sounds.  No bruits.  Soft, non-tender and non-distended without  masses, hepatosplenomegaly or hernias noted.  No guarding or rebound tenderness.  Negative Carnett sign.   Rectal:  Deferred.  Msk:  Symmetrical without gross deformities.  Good, equal movement & strength bilaterally. Pulses:  Normal pulses noted. Extremities:  No clubbing or edema.  No cyanosis. Neurologic:  Alert and oriented x3;  grossly normal neurologically. Skin:  Intact without significant lesions or rashes.  No jaundice. Lymph Nodes:  No significant cervical adenopathy. Psych:  Alert and cooperative. Normal mood and affect.  Imaging Studies: Ct Abdomen Pelvis W Contrast  Result Date: 06/30/2017 CLINICAL DATA:  Lower abdominal pain for 3 weeks, periumbilical pain. Diagnosed with diverticulitis 3 weeks ago. History of hysterectomy. EXAM: CT ABDOMEN AND PELVIS WITH CONTRAST TECHNIQUE: Multidetector CT imaging of the abdomen and pelvis was performed using the standard protocol following bolus administration of intravenous contrast. CONTRAST:  11mL ISOVUE-300 IOPAMIDOL (ISOVUE-300) INJECTION 61% COMPARISON:  CT abdomen and pelvis June 09, 2017 FINDINGS: LOWER CHEST: Lung bases are clear. Included heart size is normal. No pericardial effusion. Bilateral breast implants. Mild gas distended distal esophagus associated with reflux. HEPATOBILIARY: Liver and gallbladder are normal. PANCREAS: Normal. SPLEEN: Normal. ADRENALS/URINARY TRACT: Kidneys are orthotopic, demonstrating symmetric enhancement. No nephrolithiasis, hydronephrosis or solid renal masses. The unopacified ureters are normal in course and caliber. Delayed imaging through the kidneys demonstrates symmetric prompt contrast excretion within the proximal urinary collecting system. Urinary bladder is partially distended and unremarkable. Normal adrenal glands. STOMACH/BOWEL: The stomach, small and large bowel are normal in course and caliber without inflammatory changes, sensitivity decreased without oral contrast. Severe descending and  sigmoid colonic diverticulosis, interval resolution of diverticulitis. Moderate volume retained large bowel stool. Normal appendix. VASCULAR/LYMPHATIC: Aortoiliac vessels are normal in course and caliber. No lymphadenopathy by CT size criteria. REPRODUCTIVE: Status post hysterectomy. OTHER: No intraperitoneal free fluid or free air. MUSCULOSKELETAL: Nonacute.  Anterior abdominal wall scarring. IMPRESSION: 1. Moderate retained large bowel stool without bowel obstruction or acute intra-abdominal/pelvic process. 2. Interval resolution of sigmoid diverticulitis. Electronically Signed   By: Elon Alas M.D.   On: 06/30/2017 00:56   Ct Abdomen Pelvis W Contrast  Result Date: 06/09/2017 CLINICAL DATA:  Pt states left lower abdominal pain with nausea and fever x 4 days. Intermittent sharp pains. Denies any diarrhea or constipation but c/o "fullness" Hx of diverticulitis. Hx of hysterectomy. EXAM: CT ABDOMEN AND PELVIS WITH CONTRAST TECHNIQUE: Multidetector CT imaging of the abdomen and pelvis was performed using the standard protocol following bolus administration of intravenous contrast. CONTRAST:  165mL ISOVUE-300 IOPAMIDOL (ISOVUE-300) INJECTION 61% CONTRAST EXTRAVASATION CONSULTATION: Type of contrast:  Isovue 300 Site of extravasation: Left upper arm Estimated volume of extravasation: 95 ml Area of extravasation scanned with CT? yes PATIENT'S SIGNS AND SYMPTOMS Skin blistering/ulceration: no Decrease capillary refill: no Change in skin color: no Decreased motor function or severe tightness: no Decreased pulses distal to site of extravasation: no Altered sensation: no Increasing pain or signs of increased swelling during observation: No TREATMENT Observation period at site: 2 hours Limb elevation: yes Ice packs applied: yes Heat pads applied: No Plastic surgery consulted? no DOCUMENTATION AND FOLLOW-UP Site contrast extravasation forms submitted? yes Post extravasation orders completed? yes Was additional  follow up assigned to PA's? Yes Patient's questions answered? yes Patient instructed to call [phone] or seek immediate medical care if symptoms progress. COMPARISON:  10/15/2010 FINDINGS: Lower chest: Minimal dependent atelectasis in the visualized lung bases. No pleural or pericardial effusion. Heart size normal. Bilateral breast prostheses partially seen. Hepatobiliary: No focal liver abnormality is seen. No gallstones, gallbladder wall thickening, or biliary dilatation. Pancreas: Unremarkable. No pancreatic ductal dilatation or surrounding inflammatory changes. Spleen: Normal in size without focal abnormality. Adrenals/Urinary Tract: Adrenal glands are unremarkable. Kidneys are normal, without renal calculi, focal lesion, or hydronephrosis. Bladder is unremarkable. Stomach/Bowel: Stomach is nondistended. Small bowel decompressed. Normal appendix. Moderate colonic fecal material without dilatation. Scattered distal descending and sigmoid diverticula. Moderate inflammatory/edematous changes around the mid sigmoid colon. No evidence of abscess. Rectum decompressed. Vascular/Lymphatic: No significant vascular findings are present. No enlarged abdominal or  pelvic lymph nodes. Reproductive: Status post hysterectomy. No adnexal masses. Other: No ascites.  No free air. Musculoskeletal: No acute or significant osseous findings. IMPRESSION: 1. Sigmoid diverticulitis without abscess. Critical Value/emergent results were called by telephone at the time of interpretation on 06/09/2017 at 3:26 pm to Dr. Ria Bush , who verbally acknowledged these results. Electronically Signed   By: Lucrezia Europe M.D.   On: 06/09/2017 15:29    Assessment and Plan:   Karen Rodriguez is a 52 y.o. y/o female who comes in with abdominal pain that lasted approximate 1 day and was a burning sensation and not crampy like typical intestinal pain. The pain was not associated with any fatty foods or greasy foods.  She also reports that she  felt better with her knees bent.  With the feeling of her knees bent and the burning pain, musculoskeletal pain such as an abdominal wall cramp or spasm could explain her symptoms.  Since the patient has been doing better on Protonix and she did report some nausea, she will be set up for a EGD while continuing the Protonix.  She has also been told that since she has not had a colonoscopy for screening purposes and because of her recurrent diverticulitis it would be best to do both procedures and the same time.  The patient will wait for 3 weeks for her diverticulitis to completely heal prior to the procedure and then she will be set up for an EGD and colonoscopy. I have discussed risks & benefits which include, but are not limited to, bleeding, infection, perforation & drug reaction.  The patient agrees with this plan & written consent will be obtained.     Lucilla Lame, MD. Marval Regal   Note: This dictation was prepared with Dragon dictation along with smaller phrase technology. Any transcriptional errors that result from this process are unintentional.

## 2017-07-08 ENCOUNTER — Other Ambulatory Visit: Payer: Self-pay

## 2017-07-08 DIAGNOSIS — Z1211 Encounter for screening for malignant neoplasm of colon: Secondary | ICD-10-CM

## 2017-07-12 ENCOUNTER — Ambulatory Visit: Payer: Self-pay | Admitting: Gastroenterology

## 2017-08-03 ENCOUNTER — Encounter
Admission: RE | Disposition: A | Discharge status: Home / Self Care | Payer: Self-pay | Source: Ambulatory Visit | Attending: Gastroenterology

## 2017-08-03 ENCOUNTER — Ambulatory Visit: Payer: BLUE CROSS/BLUE SHIELD | Admitting: Registered Nurse

## 2017-08-03 ENCOUNTER — Encounter: Payer: Self-pay | Admitting: *Deleted

## 2017-08-03 ENCOUNTER — Ambulatory Visit
Admission: RE | Admit: 2017-08-03 | Discharge: 2017-08-03 | Disposition: A | Discharge status: Home / Self Care | Payer: BLUE CROSS/BLUE SHIELD | Source: Ambulatory Visit | Attending: Gastroenterology | Admitting: Gastroenterology

## 2017-08-03 DIAGNOSIS — F410 Panic disorder [episodic paroxysmal anxiety] without agoraphobia: Secondary | ICD-10-CM | POA: Diagnosis not present

## 2017-08-03 DIAGNOSIS — Z881 Allergy status to other antibiotic agents status: Secondary | ICD-10-CM | POA: Insufficient documentation

## 2017-08-03 DIAGNOSIS — Z885 Allergy status to narcotic agent status: Secondary | ICD-10-CM | POA: Diagnosis not present

## 2017-08-03 DIAGNOSIS — Z1211 Encounter for screening for malignant neoplasm of colon: Secondary | ICD-10-CM

## 2017-08-03 DIAGNOSIS — R1013 Epigastric pain: Secondary | ICD-10-CM | POA: Insufficient documentation

## 2017-08-03 DIAGNOSIS — K5730 Diverticulosis of large intestine without perforation or abscess without bleeding: Secondary | ICD-10-CM | POA: Insufficient documentation

## 2017-08-03 DIAGNOSIS — D125 Benign neoplasm of sigmoid colon: Secondary | ICD-10-CM | POA: Diagnosis not present

## 2017-08-03 DIAGNOSIS — K635 Polyp of colon: Secondary | ICD-10-CM

## 2017-08-03 DIAGNOSIS — D124 Benign neoplasm of descending colon: Secondary | ICD-10-CM

## 2017-08-03 DIAGNOSIS — R110 Nausea: Secondary | ICD-10-CM | POA: Diagnosis not present

## 2017-08-03 DIAGNOSIS — K6389 Other specified diseases of intestine: Secondary | ICD-10-CM | POA: Diagnosis not present

## 2017-08-03 DIAGNOSIS — Z8719 Personal history of other diseases of the digestive system: Secondary | ICD-10-CM | POA: Insufficient documentation

## 2017-08-03 DIAGNOSIS — K5790 Diverticulosis of intestine, part unspecified, without perforation or abscess without bleeding: Secondary | ICD-10-CM | POA: Diagnosis not present

## 2017-08-03 DIAGNOSIS — Z85828 Personal history of other malignant neoplasm of skin: Secondary | ICD-10-CM | POA: Insufficient documentation

## 2017-08-03 DIAGNOSIS — Z888 Allergy status to other drugs, medicaments and biological substances status: Secondary | ICD-10-CM | POA: Diagnosis not present

## 2017-08-03 HISTORY — PX: ESOPHAGOGASTRODUODENOSCOPY (EGD) WITH PROPOFOL: SHX5813

## 2017-08-03 HISTORY — PX: COLONOSCOPY WITH PROPOFOL: SHX5780

## 2017-08-03 SURGERY — COLONOSCOPY WITH PROPOFOL
Anesthesia: General

## 2017-08-03 MED ORDER — FENTANYL CITRATE (PF) 100 MCG/2ML IJ SOLN
INTRAMUSCULAR | Status: DC | PRN
  Administered 2017-08-03: 25 ug via INTRAVENOUS
  Administered 2017-08-03: 50 ug via INTRAVENOUS
  Administered 2017-08-03: 25 ug via INTRAVENOUS

## 2017-08-03 MED ORDER — SODIUM CHLORIDE 0.9 % IV SOLN
INTRAVENOUS | Status: DC
  Administered 2017-08-03: 1000 mL via INTRAVENOUS

## 2017-08-03 MED ORDER — FENTANYL CITRATE (PF) 100 MCG/2ML IJ SOLN
INTRAMUSCULAR | Status: AC
  Filled 2017-08-03: qty 2

## 2017-08-03 MED ORDER — MIDAZOLAM HCL 2 MG/2ML IJ SOLN: INTRAMUSCULAR | Status: DC | PRN

## 2017-08-03 MED ORDER — PROPOFOL 500 MG/50ML IV EMUL
INTRAVENOUS | Status: DC | PRN
  Administered 2017-08-03: 140 ug/kg/min via INTRAVENOUS

## 2017-08-03 MED ORDER — GLYCOPYRROLATE 0.2 MG/ML IJ SOLN
INTRAMUSCULAR | Status: DC | PRN
  Administered 2017-08-03: 0.2 mg via INTRAVENOUS

## 2017-08-03 MED ORDER — PROPOFOL 10 MG/ML IV BOLUS
INTRAVENOUS | Status: DC | PRN
  Administered 2017-08-03: 40 mg via INTRAVENOUS
  Administered 2017-08-03: 30 mg via INTRAVENOUS
  Administered 2017-08-03: 70 mg via INTRAVENOUS

## 2017-08-03 MED ORDER — LIDOCAINE HCL (CARDIAC) 20 MG/ML IV SOLN
INTRAVENOUS | Status: DC | PRN
  Administered 2017-08-03: 60 mg via INTRAVENOUS

## 2017-08-03 NOTE — Anesthesia Post-op Follow-up Note (Signed)
Anesthesia QCDR form completed.        

## 2017-08-03 NOTE — Transfer of Care (Signed)
Immediate Anesthesia Transfer of Care Note  Patient: [name]  Procedure(s) Performed: COLONOSCOPY WITH PROPOFOL (N/A ) ESOPHAGOGASTRODUODENOSCOPY (EGD) WITH PROPOFOL (N/A )  Patient Location: PACU  Anesthesia Type:General  Level of Consciousness: sedated  Airway & Oxygen Therapy: Patient Spontanous Breathing and Patient connected to nasal cannula oxygen  Post-op Assessment: Report given to RN and Post -op Vital signs reviewed and stable  Post vital signs: Reviewed and stable  Last Vitals:  Vitals:   08/03/17 1011 08/03/17 1012  BP: 105/65 105/65  Pulse: 72 78  Resp: 16 10  Temp: (!) 36.1 C (!) 36.1 C  SpO2: 97% 97%    Last Pain:  Vitals:   08/03/17 1011  PainSc: Asleep         Complications: No apparent anesthesia complications

## 2017-08-03 NOTE — Anesthesia Postprocedure Evaluation (Signed)
Anesthesia Post Note  Patient: Karen Rodriguez  Procedure(s) Performed: COLONOSCOPY WITH PROPOFOL (N/A ) ESOPHAGOGASTRODUODENOSCOPY (EGD) WITH PROPOFOL (N/A )  Patient location during evaluation: Endoscopy Anesthesia Type: General Level of consciousness: awake and alert Pain management: pain level controlled Vital Signs Assessment: post-procedure vital signs reviewed and stable Respiratory status: spontaneous breathing, nonlabored ventilation, respiratory function stable and patient connected to nasal cannula oxygen Cardiovascular status: blood pressure returned to baseline and stable Postop Assessment: no apparent nausea or vomiting Anesthetic complications: no     Last Vitals:  Vitals:   08/03/17 1021 08/03/17 1031  BP: 117/79   Pulse: 88   Resp: 15   Temp:    SpO2: 98% 99%    Last Pain:  Vitals:   08/03/17 1031  PainSc: 0-No pain                 Romy Mcgue S

## 2017-08-03 NOTE — Op Note (Signed)
[facility]astroenter[facility] Patient Name: Karen Rodriguez Procedure Date: 08/03/2017 9:22 AM MRN: [id number] Account #: [id number] Date of Birth: 1965/10/01 Admit Type: Outpatient Age: 52 Room: [facility] ENDO ROOM 4 Gender: Female Note Status: Finalized Procedure:            Colonoscopy Indications:          Screening for colorectal malignant neoplasm Providers:            Lucilla Lame MD, MD Medicines:            Propofol per Anesthesia Complications:        No immediate complications. Procedure:            Pre-Anesthesia Assessment:                       - Prior to the procedure, a History and Physical was                        performed, and patient medications and allergies were                        reviewed. The patient's tolerance of previous                        anesthesia was also reviewed. The risks and benefits of                        the procedure and the sedation options and risks were                        discussed with the patient. All questions were                        answered, and informed consent was obtained. Prior                        Anticoagulants: The patient has taken no previous                        anticoagulant or antiplatelet agents. ASA Grade                        Assessment: II - A patient with mild systemic disease.                        After reviewing the risks and benefits, the patient was                        deemed in satisfactory condition to undergo the                        procedure.                       After obtaining informed consent, the colonoscope was                        passed under direct vision. Throughout the procedure,                        the patient's blood pressure,  pulse, and oxygen                        saturations were monitored continuously. The                        Colonoscope was introduced through the anus and                        advanced to the the cecum, identified by  appendiceal                        orifice and ileocecal valve. The colonoscopy was                        performed without difficulty. The patient tolerated the                        procedure well. The quality of the bowel preparation                        was excellent. Findings:      The perianal and digital rectal examinations were normal.      A 3 mm polyp was found in the descending colon. The polyp was sessile.       The polyp was removed with a cold biopsy forceps. Resection and       retrieval were complete.      A 5 mm polyp was found in the sigmoid colon. The polyp was sessile. The       polyp was removed with a cold biopsy forceps. Resection and retrieval       were complete.      A few small-mouthed diverticula were found in the sigmoid colon. Impression:           - One 3 mm polyp in the descending colon, removed with                        a cold biopsy forceps. Resected and retrieved.                       - One 5 mm polyp in the sigmoid colon, removed with a                        cold biopsy forceps. Resected and retrieved.                       - Diverticulosis in the sigmoid colon. Recommendation:       - Discharge patient to home.                       - Resume previous diet.                       - Continue present medications.                       - Await pathology results.                       - Repeat colonoscopy in 5 years if polyp adenoma and 10  years if hyperplastic Procedure Code(s):    --- Professional ---                       [phone], Colonoscopy, flexible; with biopsy, single or                        multiple Diagnosis Code(s):    --- Professional ---                       Z12.11, Encounter for screening for malignant neoplasm                        of colon                       D12.5, Benign neoplasm of sigmoid colon                       D12.4, Benign neoplasm of descending colon CPT copyright 2016 American Medical  Association. All rights reserved. The codes documented in this report are preliminary and upon coder review may  be revised to meet current compliance requirements. Lucilla Lame MD, MD 08/03/2017 10:10:10 AM This report has been signed electronically. Number of Addenda: 0 Note Initiated On: 08/03/2017 9:22 AM Scope Withdrawal Time: 0 hours 9 minutes 14 seconds  Total Procedure Duration: 0 hours 11 minutes 58 seconds       [facility]

## 2017-08-03 NOTE — Anesthesia Preprocedure Evaluation (Signed)
Anesthesia Evaluation  Patient identified by MRN, date of birth, ID band Patient awake    Reviewed: Allergy & Precautions, NPO status , Patient's Chart, lab work & pertinent test results, reviewed documented beta blocker date and time   Airway Mallampati: II  TM Distance: >3 FB     Dental  (+) Chipped   Pulmonary           Cardiovascular      Neuro/Psych Anxiety    GI/Hepatic   Endo/Other  Hyperthyroidism   Renal/GU      Musculoskeletal   Abdominal   Peds  Hematology   Anesthesia Other Findings   Reproductive/Obstetrics                             Anesthesia Physical Anesthesia Plan  ASA: II  Anesthesia Plan: General   Post-op Pain Management:    Induction: Intravenous  PONV Risk Score and Plan:   Airway Management Planned:   Additional Equipment:   Intra-op Plan:   Post-operative Plan:   Informed Consent: I have reviewed the patients History and Physical, chart, labs and discussed the procedure including the risks, benefits and alternatives for the proposed anesthesia with the patient or authorized representative who has indicated his/her understanding and acceptance.     Plan Discussed with: CRNA  Anesthesia Plan Comments:         Anesthesia Quick Evaluation

## 2017-08-03 NOTE — Op Note (Signed)
[facility] Gastroenterology Patient Name: Karen Rodriguez Procedure Date: 08/03/2017 9:22 AM MRN: [id number] Account #: [id number] Date of Birth: 03/23/1966 Admit Type: Outpatient Age: 52 Room: [facility] ENDO ROOM 4 Gender: Female Note Status: Finalized Procedure:            Upper GI endoscopy Indications:          Epigastric abdominal pain Providers:            Lucilla Lame MD, MD Referring MD:         Ria Bush (Referring MD) Medicines:            Propofol per Anesthesia Complications:        No immediate complications. Procedure:            Pre-Anesthesia Assessment:                       - Prior to the procedure, a History and Physical was                        performed, and patient medications and allergies were                        reviewed. The patient's tolerance of previous                        anesthesia was also reviewed. The risks and benefits of                        the procedure and the sedation options and risks were                        discussed with the patient. All questions were                        answered, and informed consent was obtained. Prior                        Anticoagulants: The patient has taken no previous                        anticoagulant or antiplatelet agents. ASA Grade                        Assessment: II - A patient with mild systemic disease.                        After reviewing the risks and benefits, the patient was                        deemed in satisfactory condition to undergo the                        procedure.                       After obtaining informed consent, the endoscope was                        passed under direct vision. Throughout the procedure,  the patient's blood pressure, pulse, and oxygen                        saturations were monitored continuously. The Endoscope                        was introduced through the mouth, and advanced to the            second part of duodenum. The upper GI endoscopy was                        accomplished without difficulty. The patient tolerated                        the procedure well. Findings:      The examined esophagus was normal.      The entire examined stomach was normal.      The examined duodenum was normal. Impression:           - Normal esophagus.                       - Normal stomach.                       - Normal examined duodenum.                       - No specimens collected. Recommendation:       - Discharge patient to home.                       - Resume previous diet.                       - Continue present medications. Procedure Code(s):    --- Professional ---                       [phone], Esophagogastroduodenoscopy, flexible, transoral;                        diagnostic, including collection of specimen(s) by                        brushing or washing, when performed (separate procedure) Diagnosis Code(s):    --- Professional ---                       R10.13, Epigastric pain CPT copyright 2016 American Medical Association. All rights reserved. The codes documented in this report are preliminary and upon coder review may  be revised to meet current compliance requirements. Lucilla Lame MD, MD 08/03/2017 9:52:51 AM This report has been signed electronically. Number of Addenda: 0 Note Initiated On: 08/03/2017 9:22 AM      [facility]

## 2017-08-03 NOTE — H&P (Signed)
Lucilla Lame, MD [facility] [address] [place], [place] [address] Phone:[phone] Fax : [phone]  Primary Care Physician:  Ria Bush, MD Primary Gastroenterologist:  Dr. Allen Norris  Pre-Procedure History & Physical: HPI:  Karen Rodriguez is a 52 y.o. female is here for an endoscopy and colonoscopy.   Past Medical History:  Diagnosis Date  . Anxiety state, unspecified   . Chest pain, unspecified 1997   negative cardiolyte and echo  . Diverticulitis 2009, 2012   dx by CT x 2 (2009, 2012).  never had colonoscopy done.  . Multinodular goiter   . Panic attacks   . Recurrent sinusitis   . Seasonal allergies   . Squamous cell skin cancer   . Subclinical hyperthyroidism 2007  . Teratoma of left ovary 2017   s/p hysterectomy with LSO    Past Surgical History:  Procedure Laterality Date  . ABDOMINAL HYSTERECTOMY    . AUGMENTATION MAMMAPLASTY Bilateral   . BREAST BIOPSY Left 1996   benign palpable mass excised.  Marland Kitchen BREAST BIOPSY Left 04/15/2016   LEFT BREAST, 11 O'CLOCK, FINE NEEDLE ASPIRATION:  . BREAST CYST ASPIRATION Left 2017  . BREAST ENHANCEMENT SURGERY  1988   Dr. Cathie Hoops; then revised in [facility] ([place])  . cardiolite  1997   normal, negative echo (Dr. Enzo Bi)  . COLONOSCOPY  ?  . CT ABD W & PELVIS WO CM  03/2008   splenomegaly, sigmoid diverticulitis, 2cm dermoid L ovary, constipation  . CT ABD W & PELVIS WO CM  09/2010   acute focal uncomplicated upper sigmoid diverticulosis, dermoid cyst L ovary, constipation  . FINE NEEDLE ASPIRATION Right 11/2012   thyroid nodule - benign follicular cells consistent with colloid nodule Tami Ribas)  . LAPAROSCOPIC VAGINAL HYSTERECTOMY WITH SALPINGO OOPHORECTOMY Bilateral 01/06/2016   LAPAROSCOPIC ASSISTED VAGINAL HYSTERECTOMY WITH BILATERAL SALPINGO OOPHORECTOMY;  Rubie Maid, MD  . nsvd     x2  . tear duct surgery  06/1998   Right  . thyroid uptake scan  2007   early hyperthyroidism, suggestive early toxic  adenomas of left lobe ([place])  . thyroid US  2007   benign cyst - R lobe 36mm, 1.1cm mid post left lobe, 1.2 cm mid left lobe  . TONSILLECTOMY      Prior to Admission medications   Not on File    Allergies as of 07/08/2017 - Review Complete 07/06/2017  Allergen Reaction Noted  . Azithromycin  03/29/2007  . Celecoxib  03/29/2007  . Codeine Rash and Other (See Comments) 06/20/2014    Family History  Problem Relation Age of Onset  . Breast cancer Mother 13  . Hypertension Mother   . Stroke Mother 62  . Other Father        CCY  . Breast cancer Cousin   . Stroke Unknown        GM    Social History   Socioeconomic History  . Marital status: Married    Spouse name: Not on file  . Number of children: 2  . Years of education: Not on file  . Highest education level: Not on file  Social Needs  . Financial resource strain: Not on file  . Food insecurity - worry: Not on file  . Food insecurity - inability: Not on file  . Transportation needs - medical: Not on file  . Transportation needs - non-medical: Not on file  Occupational History  . Occupation: Glass blower/designer  Tobacco Use  . Smoking status: Never Smoker  .  Smokeless tobacco: Never Used  Substance and Sexual Activity  . Alcohol use: Yes    Alcohol/week: 1.8 - 2.4 oz    Types: 3 - 4 Glasses of wine per week    Comment: Glass of wine occasionally  . Drug use: No  . Sexual activity: Yes    Birth control/protection: None    Comment: Husband had vasectomcy 1996  Other Topics Concern  . Not on file  Social History Narrative   Married; lives with husband      2 children      Insurance underwriter    Review of Systems: See HPI, otherwise negative ROS  Physical Exam: BP 132/80   Pulse 90   Temp (!) 97.5 F (36.4 C)   Resp 18   Ht 5' 8.5" (1.74 m)   Wt 142 lb (64.4 kg)   LMP 12/17/2015 (Exact Date)   SpO2 100%   BMI 21.28 kg/m  General:   Alert,  pleasant and cooperative in  NAD Head:  Normocephalic and atraumatic. Neck:  Supple; no masses or thyromegaly. Lungs:  Clear throughout to auscultation.    Heart:  Regular rate and rhythm. Abdomen:  Soft, nontender and nondistended. Normal bowel sounds, without guarding, and without rebound.   Neurologic:  Alert and  oriented x4;  grossly normal neurologically.  Impression/Plan: Karen Rodriguez is here for an endoscopy and colonoscopy to be performed for screening and epigastric pain  Risks, benefits, limitations, and alternatives regarding  endoscopy and colonoscopy have been reviewed with the patient.  Questions have been answered.  All parties agreeable.   Lucilla Lame, MD  08/03/2017, 9:35 AM

## 2017-08-04 ENCOUNTER — Encounter: Payer: Self-pay | Admitting: Gastroenterology

## 2017-08-05 LAB — SURGICAL PATHOLOGY

## 2017-08-07 ENCOUNTER — Encounter: Payer: Self-pay | Admitting: Family Medicine

## 2017-08-09 ENCOUNTER — Encounter: Payer: Self-pay | Admitting: Gastroenterology

## 2017-08-17 ENCOUNTER — Encounter: Payer: Self-pay | Admitting: Family Medicine

## 2017-08-18 DIAGNOSIS — D2262 Melanocytic nevi of left upper limb, including shoulder: Secondary | ICD-10-CM | POA: Diagnosis not present

## 2017-08-18 DIAGNOSIS — D2272 Melanocytic nevi of left lower limb, including hip: Secondary | ICD-10-CM | POA: Diagnosis not present

## 2017-08-18 DIAGNOSIS — Z85828 Personal history of other malignant neoplasm of skin: Secondary | ICD-10-CM | POA: Diagnosis not present

## 2017-08-18 DIAGNOSIS — D2261 Melanocytic nevi of right upper limb, including shoulder: Secondary | ICD-10-CM | POA: Diagnosis not present

## 2017-09-02 ENCOUNTER — Ambulatory Visit: Payer: BLUE CROSS/BLUE SHIELD | Admitting: General Surgery

## 2017-10-28 DIAGNOSIS — H524 Presbyopia: Secondary | ICD-10-CM | POA: Diagnosis not present

## 2017-11-09 ENCOUNTER — Encounter: Payer: Self-pay | Admitting: General Surgery

## 2017-11-09 ENCOUNTER — Ambulatory Visit: Payer: Self-pay

## 2017-11-09 ENCOUNTER — Ambulatory Visit: Payer: BLUE CROSS/BLUE SHIELD | Admitting: General Surgery

## 2017-11-09 VITALS — BP 124/78 | HR 82 | Ht 68.0 in | Wt 154.0 lb

## 2017-11-09 DIAGNOSIS — D242 Benign neoplasm of left breast: Secondary | ICD-10-CM

## 2017-11-09 NOTE — Progress Notes (Signed)
Patient ID: Karen Rodriguez, female   DOB: Apr 27, 1966, 52 y.o.   MRN: [id number]  Chief Complaint  Patient presents with  . Follow-up    HPI Karen Rodriguez is a 52 y.o. female here today for a left breast ultrasound . Patient states she is doing well. Patient see Dr. Marcelline Rodriguez.   The patient was originally scheduled for a follow-up ultrasound in January 2019.  This was postponed due to several health issues involving her husband requiring a cardiac ablation and catheterization.  Previous mammogram and ultrasound of July 2018 showed a well-defined mass in the upper inner quadrant of the left breast thought to represent a fibroadenoma and a six-month follow-up ultrasound have been recommended.  Annual mammograms are due in July 2019. HPI  Past Medical History:  Diagnosis Date  . Anxiety state, unspecified   . Chest pain, unspecified 1997   negative cardiolyte and echo  . Diverticulitis 2009, 2012   dx by CT x 2 (2009, 2012).  never had colonoscopy done.  . Multinodular goiter   . Panic attacks   . Recurrent sinusitis   . Seasonal allergies   . Squamous cell skin cancer   . Subclinical hyperthyroidism 2007  . Teratoma of left ovary 2017   s/p hysterectomy with LSO    Past Surgical History:  Procedure Laterality Date  . ABDOMINAL HYSTERECTOMY    . AUGMENTATION MAMMAPLASTY Bilateral   . BREAST BIOPSY Left 1996   benign palpable mass excised.  Marland Kitchen BREAST BIOPSY Left 04/15/2016   LEFT BREAST, 11 O'CLOCK, FINE NEEDLE ASPIRATION:  . BREAST CYST ASPIRATION Left 2017  . BREAST ENHANCEMENT SURGERY  1988   Dr. Cathie Rodriguez; then revised in [facility] ([place])  . cardiolite  1997   normal, negative echo (Dr. Enzo Rodriguez)  . COLONOSCOPY WITH PROPOFOL N/A 08/03/2017   HP, rpt 10 yrs Karen Rodriguez, Karen Rodriguez)  . CT ABD W & PELVIS WO CM  03/2008   splenomegaly, sigmoid diverticulitis, 2cm dermoid L ovary, constipation  . CT ABD W & PELVIS WO CM  09/2010   acute focal uncomplicated upper sigmoid  diverticulosis, dermoid cyst L ovary, constipation  . ESOPHAGOGASTRODUODENOSCOPY (EGD) WITH PROPOFOL N/A 08/03/2017   WNL - Karen Lame, Rodriguez  . FINE NEEDLE ASPIRATION Right 11/2012   thyroid nodule - benign follicular cells consistent with colloid nodule Karen Rodriguez)  . LAPAROSCOPIC VAGINAL HYSTERECTOMY WITH SALPINGO OOPHORECTOMY Bilateral 01/06/2016   LAPAROSCOPIC ASSISTED VAGINAL HYSTERECTOMY WITH BILATERAL SALPINGO OOPHORECTOMY;  Karen Maid, Rodriguez  . nsvd     x2  . tear duct surgery  06/1998   Right  . thyroid uptake scan  2007   early hyperthyroidism, suggestive early toxic adenomas of left lobe (Karen Rodriguez)  . thyroid US  2007   benign cyst - R lobe 86m, 1.1cm mid post left lobe, 1.2 cm mid left lobe  . TONSILLECTOMY      Family History  Problem Relation Age of Onset  . Breast cancer Rodriguez 570 . Karen Rodriguez   . Stroke Rodriguez 734 . Karen Rodriguez        CCY  . Breast cancer Cousin   . Stroke Unknown        GM    Social History Social History   Tobacco Use  . Smoking status: Never Smoker  . Smokeless tobacco: Never Used  Substance Use Topics  . Alcohol use: Yes    Alcohol/week: 1.8 - 2.4 oz    Types: 3 - 4 Glasses of wine per week  Comment: Glass of wine occasionally  . Drug use: No    Allergies  Allergen Reactions  . Azithromycin     REACTION: UNSPECIFIED  . Celecoxib     REACTION: SWELLING  . Codeine Rash and Karen (See Comments)    Upset stomach    No current outpatient medications on file.   No current facility-administered medications for this visit.     Review of Systems Review of Systems  Constitutional: Negative.   Respiratory: Negative.   Cardiovascular: Negative.     Blood pressure 124/78, pulse 82, height '5\' 8"'$  (1.727 m), weight 154 lb (69.9 kg), last menstrual period 12/17/2015.  Physical Exam Physical Exam  Constitutional: She is oriented to person, place, and time. She appears well-developed and well-nourished.  Cardiovascular:  Normal rate and regular rhythm.  Pulmonary/Chest: Effort normal and breath sounds normal.    Lymphadenopathy:    She has no cervical adenopathy.    She has no axillary adenopathy.       Right: No supraclavicular adenopathy present.       Left: No supraclavicular adenopathy present.  Neurological: She is alert and oriented to person, place, and time.  Skin: Skin is warm and dry.    Data Reviewed Ultrasound examination of the left breast in the 11 o'clock position 1 cm from the nipple showed a well-defined hypoechoic mass abutting the pectoralis fascia measuring 0.4 x 0.94 x 1.08 cm.  Smooth, soft undulation of the border, strong posterior acoustic enhancement.  Normal edge effect.  This area previously measured 0.5 x 1.0 x 1.5 cm.  Significant decrease in volume since her July 2018 exam.  Also identified were multiple small cysts throughout the breast, all met the criteria for a simple cyst with the exception of a 0.27 x 0.38 x 0.46 cm hypoechoic mass in the 6 o'clock position, 3 cm from the nipple.  Here internal debris suggested a complex cyst.  Smooth borders, faint posterior acoustic enhancement and smooth edge effect.  Rodriguez-RADS-2.  Assessment    Shrinking left breast fibroadenoma.    Plan  Patient to have a screening mammogram in July 2019 and than return in July 2020 of next year.  The patient is aware to call back for any questions or concerns.   HPI, Physical Exam, Assessment and Plan have been scribed under the direction and in the presence of Karen Ard, Rodriguez.  Karen Rodriguez, CMA  I have completed the exam and reviewed the above documentation for accuracy and completeness.  I agree with the above.  Haematologist has been used and any errors in dictation or transcription are unintentional.  Karen Rodriguez, M.D., F.A.C.S.  Karen Rodriguez 11/10/2017, 8:33 AM

## 2017-11-09 NOTE — Patient Instructions (Addendum)
Patient to have a screening mammogram in July 2019 and than return in July 2020 of next year.

## 2018-01-23 ENCOUNTER — Encounter: Payer: Self-pay | Admitting: Family Medicine

## 2018-01-24 DIAGNOSIS — B001 Herpesviral vesicular dermatitis: Secondary | ICD-10-CM | POA: Diagnosis not present

## 2018-06-21 ENCOUNTER — Ambulatory Visit: Payer: Self-pay

## 2018-06-21 DIAGNOSIS — W182XXA Fall in (into) shower or empty bathtub, initial encounter: Secondary | ICD-10-CM | POA: Diagnosis not present

## 2018-06-21 DIAGNOSIS — S2232XA Fracture of one rib, left side, initial encounter for closed fracture: Secondary | ICD-10-CM | POA: Diagnosis not present

## 2018-06-21 DIAGNOSIS — R0781 Pleurodynia: Secondary | ICD-10-CM | POA: Diagnosis not present

## 2018-06-21 DIAGNOSIS — S2242XA Multiple fractures of ribs, left side, initial encounter for closed fracture: Secondary | ICD-10-CM | POA: Diagnosis not present

## 2018-06-21 NOTE — Telephone Encounter (Signed)
Patient called and says she fell getting out of the tub, slipped and the tub hit under her left arm and hurt her left side. She says this happened on Sunday night. No bruising or swelling, just painful when she moves, takes a deep breath, sneeze, or cough. She says it hurts to even wear a bra. She says she's not in pain until when she moves or start doing something. I advised to come in for an appointment tomorrow, due to no availability today with PCP or any providers in the practice, or go to the ED or UC, she asked to be scheduled an appointment and if she gets worse, she will call and cancel, then go to [facility] or ED. She says she really just wanted to know what can she do at home. I advised to rule out a rib fracture and xray is recommended, she verbalized understanding. Appointment scheduled for tomorrow, 06/22/18 at 1130 with Dr. Danise Mina, care advice given, patient verbalized understanding.   Reason for Disposition . [1] Chest wall swelling or pain AND [2] present > 7 days  Answer Assessment - Initial Assessment Questions 1. MECHANISM: "How did the injury happen?"     Slipped getting out of the tub  2. ONSET: "When did the injury happen?" (Minutes or hours ago)     Sunday night 3. LOCATION: "Where on the chest is the injury located?"     Left side 4. APPEARANCE: "What does the injury look like?"     No bruising or swelling, hurts to wear a bra 5. BLEEDING: "Is there any bleeding now? If so, ask: How long has it been bleeding?"     N/A 6. SEVERITY: "Any difficulty with breathing?"     Try to take a deep breath, sneeze, cough it hurts 7. SIZE: For cuts, bruises, or swelling, ask: "How large is it?" (e.g., inches or centimeters)     N/A 8. PAIN: "Is there pain?" If so, ask: "How bad is the pain?"   (e.g., Scale 1-10; or mild, moderate, severe)     When moving it takes my breath away 9. TETANUS: For any breaks in the skin, ask: "When was the last tetanus booster?"     N/A 10. PREGNANCY: "Is  there any chance you are pregnant?" "When was your last menstrual period?"       No  Protocols used: CHEST INJURY-A-AH

## 2018-06-22 ENCOUNTER — Ambulatory Visit: Payer: Self-pay | Admitting: Family Medicine

## 2018-07-01 DIAGNOSIS — S2242XA Multiple fractures of ribs, left side, initial encounter for closed fracture: Secondary | ICD-10-CM | POA: Diagnosis not present

## 2018-07-01 DIAGNOSIS — R0781 Pleurodynia: Secondary | ICD-10-CM | POA: Diagnosis not present

## 2018-10-19 ENCOUNTER — Other Ambulatory Visit: Payer: Self-pay

## 2018-10-19 ENCOUNTER — Other Ambulatory Visit: Payer: Self-pay | Admitting: General Surgery

## 2018-10-19 DIAGNOSIS — Z1231 Encounter for screening mammogram for malignant neoplasm of breast: Secondary | ICD-10-CM

## 2018-10-19 DIAGNOSIS — N6320 Unspecified lump in the left breast, unspecified quadrant: Secondary | ICD-10-CM

## 2018-10-31 ENCOUNTER — Ambulatory Visit
Admission: RE | Admit: 2018-10-31 | Discharge: 2018-10-31 | Disposition: A | Discharge status: Home / Self Care | Payer: BC Managed Care – PPO | Source: Ambulatory Visit | Attending: General Surgery | Admitting: General Surgery

## 2018-10-31 ENCOUNTER — Other Ambulatory Visit: Payer: Self-pay

## 2018-10-31 DIAGNOSIS — R922 Inconclusive mammogram: Secondary | ICD-10-CM | POA: Diagnosis not present

## 2018-10-31 DIAGNOSIS — N6320 Unspecified lump in the left breast, unspecified quadrant: Secondary | ICD-10-CM

## 2018-10-31 DIAGNOSIS — Z1231 Encounter for screening mammogram for malignant neoplasm of breast: Secondary | ICD-10-CM | POA: Insufficient documentation

## 2018-10-31 DIAGNOSIS — N6322 Unspecified lump in the left breast, upper inner quadrant: Secondary | ICD-10-CM | POA: Diagnosis not present

## 2018-11-02 ENCOUNTER — Telehealth: Payer: Self-pay | Admitting: General Surgery

## 2018-11-02 NOTE — Telephone Encounter (Signed)
Patient is calling said she went to have her mammogram done on Monday, and said everything was fine, but is asking if she can hold off from coming due to [place] and just have a phone call with the results. Please call patient and advise.

## 2018-11-02 NOTE — Telephone Encounter (Signed)
Dr Bary Castilla is alright with doing a phone interview.

## 2018-11-10 ENCOUNTER — Telehealth (INDEPENDENT_AMBULATORY_CARE_PROVIDER_SITE_OTHER): Payer: BC Managed Care – PPO | Admitting: General Surgery

## 2018-11-10 DIAGNOSIS — D242 Benign neoplasm of left breast: Secondary | ICD-10-CM | POA: Diagnosis not present

## 2018-11-11 NOTE — Progress Notes (Addendum)
Patient ID: Karen Rodriguez, female   DOB: 19-Jul-1965, 53 y.o.   MRN: [id number]  No chief complaint on file.   HPI Karen Rodriguez is a 53 y.o. female.  Today's visit was completed by phone at the patient request due to concerns about the recent pandemic.  I confirmed the patient and the privacy of the conversation.  She reports no difficulty or pain with her breasts.  She had undergone mammography as requested.  The patient was being followed for a left breast mass thought to represent a fibroadenoma.  HPI  Past Medical History:  Diagnosis Date  . Anxiety state, unspecified   . Chest pain, unspecified 1997   negative cardiolyte and echo  . Diverticulitis 2009, 2012   dx by CT x 2 (2009, 2012).  never had colonoscopy done.  . Multinodular goiter   . Panic attacks   . Recurrent sinusitis   . Seasonal allergies   . Squamous cell skin cancer   . Subclinical hyperthyroidism 2007  . Teratoma of left ovary 2017   s/p hysterectomy with LSO    Past Surgical History:  Procedure Laterality Date  . ABDOMINAL HYSTERECTOMY    . AUGMENTATION MAMMAPLASTY Bilateral   . BREAST BIOPSY Left 1996   benign palpable mass excised.  Marland Kitchen BREAST BIOPSY Left 04/15/2016   LEFT BREAST, 11 O'CLOCK, FINE NEEDLE ASPIRATION:  . BREAST CYST ASPIRATION Left 2017  . BREAST ENHANCEMENT SURGERY  1988   Dr. Cathie Hoops; then revised in [facility] ([place])  . cardiolite  1997   normal, negative echo (Dr. Enzo Bi)  . COLONOSCOPY WITH PROPOFOL N/A 08/03/2017   HP, rpt 10 yrs Allen Norris, Darren, MD)  . CT ABD W & PELVIS WO CM  03/2008   splenomegaly, sigmoid diverticulitis, 2cm dermoid L ovary, constipation  . CT ABD W & PELVIS WO CM  09/2010   acute focal uncomplicated upper sigmoid diverticulosis, dermoid cyst L ovary, constipation  . ESOPHAGOGASTRODUODENOSCOPY (EGD) WITH PROPOFOL N/A 08/03/2017   WNL - Lucilla Lame, MD  . FINE NEEDLE ASPIRATION Right 11/2012   thyroid nodule - benign follicular cells consistent with  colloid nodule Tami Ribas)  . LAPAROSCOPIC VAGINAL HYSTERECTOMY WITH SALPINGO OOPHORECTOMY Bilateral 01/06/2016   LAPAROSCOPIC ASSISTED VAGINAL HYSTERECTOMY WITH BILATERAL SALPINGO OOPHORECTOMY;  Rubie Maid, MD  . nsvd     x2  . tear duct surgery  06/1998   Right  . thyroid uptake scan  2007   early hyperthyroidism, suggestive early toxic adenomas of left lobe ([place])  . thyroid US  2007   benign cyst - R lobe 64mm, 1.1cm mid post left lobe, 1.2 cm mid left lobe  . TONSILLECTOMY      Family History  Problem Relation Age of Onset  . Breast cancer Mother 16  . Hypertension Mother   . Stroke Mother 83  . Other Father        CCY  . Breast cancer Cousin   . Stroke Other        GM    Social History Social History   Tobacco Use  . Smoking status: Never Smoker  . Smokeless tobacco: Never Used  Substance Use Topics  . Alcohol use: Yes    Alcohol/week: 3.0 - 4.0 standard drinks    Types: 3 - 4 Glasses of wine per week    Comment: Glass of wine occasionally  . Drug use: No    Allergies  Allergen Reactions  . Azithromycin     REACTION: UNSPECIFIED  . Celecoxib  REACTION: SWELLING  . Codeine Rash and Other (See Comments)    Upset stomach    No current outpatient medications on file.   No current facility-administered medications for this visit.     Review of Systems Review of Systems  All other systems reviewed and are negative.   Last menstrual period 12/17/2015.  Physical Exam Physical Exam Telephone  Data Reviewed Bilateral mammograms dated October 31, 2018 and left breast ultrasound of the same date were independently reviewed.  The mass in the left breast has decreased in size by few millimeters over last year's exam.  BI-RADS-2.  Recommendation for annual screening mammograms.  Assessment Continued shrinkage of a suspected left breast fibroadenoma, asymptomatic.  Plan The patient will have annual screening exams with her primary care physician.  She  will notify the office if she has any change in her breast exam or new breast pathology.    Forest Gleason Brynlee Pennywell 11/11/2018, 5:32 PM

## 2019-03-13 ENCOUNTER — Other Ambulatory Visit: Payer: Self-pay | Admitting: Family Medicine

## 2019-03-13 DIAGNOSIS — Z131 Encounter for screening for diabetes mellitus: Secondary | ICD-10-CM

## 2019-03-13 DIAGNOSIS — E049 Nontoxic goiter, unspecified: Secondary | ICD-10-CM

## 2019-03-13 DIAGNOSIS — Z13220 Encounter for screening for lipoid disorders: Secondary | ICD-10-CM

## 2019-03-15 ENCOUNTER — Other Ambulatory Visit: Payer: Self-pay

## 2019-03-15 ENCOUNTER — Other Ambulatory Visit (INDEPENDENT_AMBULATORY_CARE_PROVIDER_SITE_OTHER): Payer: BC Managed Care – PPO

## 2019-03-15 DIAGNOSIS — Z131 Encounter for screening for diabetes mellitus: Secondary | ICD-10-CM | POA: Diagnosis not present

## 2019-03-15 DIAGNOSIS — E049 Nontoxic goiter, unspecified: Secondary | ICD-10-CM | POA: Diagnosis not present

## 2019-03-15 DIAGNOSIS — Z13220 Encounter for screening for lipoid disorders: Secondary | ICD-10-CM | POA: Diagnosis not present

## 2019-03-15 LAB — COMPREHENSIVE METABOLIC PANEL
ALT: 20 U/L (ref 0–35)
AST: 20 U/L (ref 0–37)
Albumin: 4.7 g/dL (ref 3.5–5.2)
Alkaline Phosphatase: 70 U/L (ref 39–117)
BUN: 17 mg/dL (ref 6–23)
CO2: 30 mEq/L (ref 19–32)
Calcium: 9.7 mg/dL (ref 8.4–10.5)
Chloride: 103 mEq/L (ref 96–112)
Creatinine, Ser: 0.67 mg/dL (ref 0.40–1.20)
GFR: 91.95 mL/min (ref >60.00)
Glucose, Bld: 101 mg/dL — ABNORMAL HIGH (ref 70–99)
Potassium: 4.4 mEq/L (ref 3.5–5.1)
Sodium: 141 mEq/L (ref 135–145)
Total Bilirubin: 0.8 mg/dL (ref 0.2–1.2)
Total Protein: 6.9 g/dL (ref 6.0–8.3)

## 2019-03-15 LAB — LIPID PANEL
Cholesterol: 184 mg/dL (ref 0–200)
HDL: 70.6 mg/dL (ref >39.00)
LDL Cholesterol: 94 mg/dL (ref 0–99)
NonHDL: 113.29
Total CHOL/HDL Ratio: 3
Triglycerides: 98 mg/dL (ref 0.0–149.0)
VLDL: 19.6 mg/dL (ref 0.0–40.0)

## 2019-03-16 LAB — TSH: TSH: 0.44 u[IU]/mL (ref 0.35–4.50)

## 2019-03-22 ENCOUNTER — Encounter: Payer: Self-pay | Admitting: Family Medicine

## 2019-03-22 ENCOUNTER — Other Ambulatory Visit: Payer: Self-pay

## 2019-03-22 ENCOUNTER — Ambulatory Visit (INDEPENDENT_AMBULATORY_CARE_PROVIDER_SITE_OTHER): Payer: BC Managed Care – PPO | Admitting: Family Medicine

## 2019-03-22 VITALS — BP 124/76 | HR 79 | Temp 97.8°F | Ht 67.5 in | Wt 161.1 lb

## 2019-03-22 DIAGNOSIS — Z0001 Encounter for general adult medical examination with abnormal findings: Secondary | ICD-10-CM | POA: Diagnosis not present

## 2019-03-22 DIAGNOSIS — E049 Nontoxic goiter, unspecified: Secondary | ICD-10-CM | POA: Diagnosis not present

## 2019-03-22 DIAGNOSIS — F338 Other recurrent depressive disorders: Secondary | ICD-10-CM

## 2019-03-22 MED ORDER — BUPROPION HCL ER (SR) 100 MG PO TB12: 100.0000 mg | ORAL_TABLET | Freq: Every day | ORAL | 3 refills | Status: DC

## 2019-03-22 NOTE — Patient Instructions (Addendum)
Sign release for records from ENT Dr Tami Ribas  We will check thyroid ultrasound again.  Trial wellbutrin 100mg  once daily - update me in 2 weeks with effect. Return in 4-6 weeks for follow up visit on mood.  Good to see you today, call us with questions.  Health Maintenance for Postmenopausal Women Menopause is a normal process in which your ability to get pregnant comes to an end. This process happens slowly over many months or years, usually between the ages of 33 and 73. Menopause is complete when you have missed your menstrual periods for 12 months. It is important to talk with your health care provider about some of the most common conditions that affect women after menopause (postmenopausal women). These include heart disease, cancer, and bone loss (osteoporosis). Adopting a healthy lifestyle and getting preventive care can help to promote your health and wellness. The actions you take can also lower your chances of developing some of these common conditions. What should I know about menopause? During menopause, you may get a number of symptoms, such as:  Hot flashes. These can be moderate or severe.  Night sweats.  Decrease in sex drive.  Mood swings.  Headaches.  Tiredness.  Irritability.  Memory problems.  Insomnia. Choosing to treat or not to treat these symptoms is a decision that you make with your health care provider. Do I need hormone replacement therapy?  Hormone replacement therapy is effective in treating symptoms that are caused by menopause, such as hot flashes and night sweats.  Hormone replacement carries certain risks, especially as you become older. If you are thinking about using estrogen or estrogen with progestin, discuss the benefits and risks with your health care provider. What is my risk for heart disease and stroke? The risk of heart disease, heart attack, and stroke increases as you age. One of the causes may be a change in the body's hormones  during menopause. This can affect how your body uses dietary fats, triglycerides, and cholesterol. Heart attack and stroke are medical emergencies. There are many things that you can do to help prevent heart disease and stroke. Watch your blood pressure  High blood pressure causes heart disease and increases the risk of stroke. This is more likely to develop in people who have high blood pressure readings, are of African descent, or are overweight.  Have your blood pressure checked: ? Every 3-5 years if you are 61-58 years of age. ? Every year if you are 38 years old or older. Eat a healthy diet   Eat a diet that includes plenty of vegetables, fruits, low-fat dairy products, and lean protein.  Do not eat a lot of foods that are high in solid fats, added sugars, or sodium. Get regular exercise Get regular exercise. This is one of the most important things you can do for your health. Most adults should:  Try to exercise for at least 150 minutes each week. The exercise should increase your heart rate and make you sweat (moderate-intensity exercise).  Try to do strengthening exercises at least twice each week. Do these in addition to the moderate-intensity exercise.  Spend less time sitting. Even light physical activity can be beneficial. Other tips  Work with your health care provider to achieve or maintain a healthy weight.  Do not use any products that contain nicotine or tobacco, such as cigarettes, e-cigarettes, and chewing tobacco. If you need help quitting, ask your health care provider.  Know your numbers. Ask your health care provider  to check your cholesterol and your blood sugar (glucose). Continue to have your blood tested as directed by your health care provider. Do I need screening for cancer? Depending on your health history and family history, you may need to have cancer screening at different stages of your life. This may include screening for:  Breast cancer.  Cervical  cancer.  Lung cancer.  Colorectal cancer. What is my risk for osteoporosis? After menopause, you may be at increased risk for osteoporosis. Osteoporosis is a condition in which bone destruction happens more quickly than new bone creation. To help prevent osteoporosis or the bone fractures that can happen because of osteoporosis, you may take the following actions:  If you are 86-34 years old, get at least 1,000 mg of calcium and at least 600 mg of vitamin D per day.  If you are older than age 56 but younger than age 58, get at least 1,200 mg of calcium and at least 600 mg of vitamin D per day.  If you are older than age 43, get at least 1,200 mg of calcium and at least 800 mg of vitamin D per day. Smoking and drinking excessive alcohol increase the risk of osteoporosis. Eat foods that are rich in calcium and vitamin D, and do weight-bearing exercises several times each week as directed by your health care provider. How does menopause affect my mental health? Depression may occur at any age, but it is more common as you become older. Common symptoms of depression include:  Low or sad mood.  Changes in sleep patterns.  Changes in appetite or eating patterns.  Feeling an overall lack of motivation or enjoyment of activities that you previously enjoyed.  Frequent crying spells. Talk with your health care provider if you think that you are experiencing depression. General instructions See your health care provider for regular wellness exams and vaccines. This may include:  Scheduling regular health, dental, and eye exams.  Getting and maintaining your vaccines. These include: ? Influenza vaccine. Get this vaccine each year before the flu season begins. ? Pneumonia vaccine. ? Shingles vaccine. ? Tetanus, diphtheria, and pertussis (Tdap) booster vaccine. Your health care provider may also recommend other immunizations. Tell your health care provider if you have ever been abused or do  not feel safe at home. Summary  Menopause is a normal process in which your ability to get pregnant comes to an end.  This condition causes hot flashes, night sweats, decreased interest in sex, mood swings, headaches, or lack of sleep.  Treatment for this condition may include hormone replacement therapy.  Take actions to keep yourself healthy, including exercising regularly, eating a healthy diet, watching your weight, and checking your blood pressure and blood sugar levels.  Get screened for cancer and depression. Make sure that you are up to date with all your vaccines. This information is not intended to replace advice given to you by your health care provider. Make sure you discuss any questions you have with your health care provider. Document Released: 06/26/2005 Document Revised: 04/27/2018 Document Reviewed: 04/27/2018 Elsevier Patient Education  2020 [employer].

## 2019-03-22 NOTE — Progress Notes (Signed)
This visit was conducted in person.  BP 124/76 (BP Location: Left Arm, Patient Position: Sitting, Cuff Size: Normal)   Pulse 79   Temp 97.8 F (36.6 C) (Temporal)   Ht 5' 7.5" (1.715 m)   Wt 161 lb 1 oz (73.1 kg)   LMP 12/17/2015 (Exact Date)   SpO2 98%   BMI 24.85 kg/m    CC: CPE Subjective:    Patient ID: Karen Rodriguez, female    DOB: Dec 17, 1965, 53 y.o.   MRN: [id number]  HPI: Karen Rodriguez is a 53 y.o. female presenting on 03/22/2019 for Annual Exam (Requesting rx for Valtrex.)   February fell out of tub and broke ribs seen at [facility].  Noticing fatigue, weight gain, L thyroid enlargement. Has previously had benign thyroid nodule ?biopsied by [facility] Tami Ribas). Not recently.   She has good support - feels she can discuss concerns with friend and sister who is a Marine scientist.   Ongoing situational stress, possible h/o SAD as has longstanding h/o struggling in winter months. Notes decreased sex drive since complete hysterectomy - this is distressing to patient.   Preventative: COLONOSCOPY WITH PROPOFOL 08/03/2017 - HP, rpt 10 yrs Allen Norris, Darren, MD) ESOPHAGOGASTRODUODENOSCOPY (EGD) WITH PROPOFOL 08/03/2017 - WNL - Lucilla Lame, MD Well woman - saw Dr Marcelline Mates s/p hysterectomy with BSO (2018) for abnormal paps  LMP prior to surgery. Never on HRT.  Mammogram 10/2018 fibrocystic changes with stable benign L breast mass rec yearly mammo - sees Dr Bary Castilla.  Flu shot - declines Tetanus - declines Seat belt use discussed Sunscreen use discussed. No changing moles on skin. Sees derm for h/o squamous cell cancers.  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 Interim medical history since our last visit reviewed. Allergies and medications reviewed and updated. No outpatient medications prior to visit.   No facility-administered medications prior to visit.      Per HPI unless specifically indicated in ROS section below Review of Systems  Constitutional: Positive for fatigue. Negative for activity change, appetite change, chills, fever and unexpected weight change.  HENT: Negative for hearing loss.   Eyes: Negative for visual disturbance.  Respiratory: Negative for cough, chest tightness, shortness of breath and wheezing.   Cardiovascular: Positive for palpitations (occasional). Negative for chest pain and leg swelling.  Gastrointestinal: Negative for abdominal distention, abdominal pain, blood in stool, constipation, diarrhea, nausea and vomiting.  Endocrine: Negative for cold intolerance and heat intolerance.  Genitourinary: Negative for difficulty urinating and hematuria.  Musculoskeletal: Negative for arthralgias, myalgias and neck pain.  Skin: Negative for rash.  Neurological: Negative for dizziness, seizures, syncope and headaches.  Hematological: Negative for adenopathy. Does not bruise/bleed easily.  Psychiatric/Behavioral: Positive for dysphoric mood (situational). The patient is not nervous/anxious.    Objective:    BP 124/76 (BP Location: Left Arm, Patient Position: Sitting, Cuff Size: Normal)   Pulse 79   Temp 97.8 F (36.6 C) (Temporal)   Ht 5' 7.5" (1.715 m)   Wt 161 lb 1 oz (73.1 kg)   LMP 12/17/2015 (Exact Date)   SpO2 98%   BMI 24.85 kg/m   Wt Readings from Last  3 Encounters:  03/22/19 161 lb 1 oz (73.1 kg)  11/09/17 154 lb (69.9 kg)  08/03/17 142 lb (64.4 kg)    Physical Exam Vitals signs and nursing note reviewed.  Constitutional:      General: She is not in acute distress.    Appearance: Normal appearance. She is well-developed. She is not ill-appearing.  HENT:     Head: Normocephalic and  atraumatic.     Right Ear: Hearing, tympanic membrane, ear canal and external ear normal.     Left Ear: Hearing, tympanic membrane, ear canal and external ear normal.     Nose: Nose normal.     Mouth/Throat:     Mouth: Mucous membranes are moist.     Pharynx: Oropharynx is clear. Uvula midline. No posterior oropharyngeal erythema.  Eyes:     General: No scleral icterus.    Conjunctiva/sclera: Conjunctivae normal.     Pupils: Pupils are equal, round, and reactive to light.  Neck:     Musculoskeletal: Normal range of motion and neck supple.     Thyroid: Thyroid mass (L sided) and thyromegaly (L sided) present. No thyroid tenderness.  Cardiovascular:     Rate and Rhythm: Normal rate and regular rhythm.     Pulses: Normal pulses.          Radial pulses are 2+ on the right side and 2+ on the left side.     Heart sounds: Normal heart sounds. No murmur.  Pulmonary:     Effort: Pulmonary effort is normal. No respiratory distress.     Breath sounds: Normal breath sounds. No wheezing, rhonchi or rales.  Abdominal:     General: Abdomen is flat. Bowel sounds are normal. There is no distension.     Palpations: Abdomen is soft. There is no mass.     Tenderness: There is no abdominal tenderness. There is no guarding or rebound.     Hernia: No hernia is present.  Musculoskeletal: Normal range of motion.     Right lower leg: No edema.     Left lower leg: No edema.  Lymphadenopathy:     Cervical: No cervical adenopathy.  Skin:    General: Skin is warm and dry.     Findings: No rash.  Neurological:     General: No focal deficit present.     Mental Status: She is alert and oriented to person, place, and time.     Comments: CN grossly intact, station and gait intact  Psychiatric:        Mood and Affect: Mood normal.        Behavior: Behavior normal.        Thought Content: Thought content normal.        Judgment: Judgment normal.       Results for orders placed or performed in visit on  03/15/19  TSH  Result Value Ref Range   TSH 0.44 0.35 - 4.50 uIU/mL  Comprehensive metabolic panel  Result Value Ref Range   Sodium 141 135 - 145 mEq/L   Potassium 4.4 3.5 - 5.1 mEq/L   Chloride 103 96 - 112 mEq/L   CO2 30 19 - 32 mEq/L   Glucose, Bld 101 (H) 70 - 99 mg/dL   BUN 17 6 - 23 mg/dL   Creatinine, Ser 0.67 0.40 - 1.20 mg/dL   Total Bilirubin 0.8 0.2 - 1.2 mg/dL   Alkaline Phosphatase 70 39 - 117 U/L   AST 20 0 - 37 U/L   ALT  20 0 - 35 U/L   Total Protein 6.9 6.0 - 8.3 g/dL   Albumin 4.7 3.5 - 5.2 g/dL   Calcium 9.7 8.4 - 10.5 mg/dL   GFR 91.95 >60.00 mL/min  Lipid panel  Result Value Ref Range   Cholesterol 184 0 - 200 mg/dL   Triglycerides 98.0 0.0 - 149.0 mg/dL   HDL 70.60 >39.00 mg/dL   VLDL 19.6 0.0 - 40.0 mg/dL   LDL Cholesterol 94 0 - 99 mg/dL   Total CHOL/HDL Ratio 3    NonHDL 113.29    Depression screen PHQ 2/9 03/22/2019  Decreased Interest 1  Down, Depressed, Hopeless 1  PHQ - 2 Score 2  Altered sleeping 3  Tired, decreased energy 3  Change in appetite 0  Feeling bad or failure about yourself  0  Trouble concentrating 2  Moving slowly or fidgety/restless 0  Suicidal thoughts 0  PHQ-9 Score 10   GAD 7 : Generalized Anxiety Score 03/22/2019  Nervous, Anxious, on Edge 1  Control/stop worrying 1  Worry too much - different things 1  Trouble relaxing 1  Restless 0  Easily annoyed or irritable 2  Afraid - awful might happen 0  Total GAD 7 Score 6   Assessment & Plan:   Problem List Items Addressed This Visit    Thyroid enlargement    L thyroid gland enlargement noted today. No h/o hypothyroidism, TSH levels remain stable. Will update thyroid US. Endorses h/o thyroid biopsy vs cyst aspiration in the past by ENT Tami Ribas) - will request records and consider return pending [place] results. Pt agrees with plan.       Seasonal affective disorder ([place])    Endorses longstanding history of struggling in winter season suggesting of seasonal affective  disorder. Also endorses decreased sex drive since complete hysterectomy 2018 which contributes to depression - suggested she return to OBGYN to explore treatment options.  Not interested in counseling at this time. Interested in medication trial - will start wellbutrin SL 100mg  once daily for the next month, RTC 1 mo f/u visit. Reviewed possible increased suicidality.       Relevant Medications   buPROPion (WELLBUTRIN SR) 100 MG 12 hr tablet   Encounter for routine adult health examination with abnormal findings - Primary    Preventative protocols reviewed and updated unless pt declined. Discussed healthy diet and lifestyle.           Meds ordered this encounter  Medications  . buPROPion (WELLBUTRIN SR) 100 MG 12 hr tablet    Sig: Take 1 tablet (100 mg total) by mouth daily.    Dispense:  30 tablet    Refill:  3  . valACYclovir (VALTREX) 1000 MG tablet    Sig: Take 1 tablet (1,000 mg total) by mouth 3 (three) times daily.    Dispense:  21 tablet    Refill:  0   No orders of the defined types were placed in this encounter.  Patient instructions: Sign release for records from ENT Dr Tami Ribas  We will check thyroid ultrasound again.  Trial wellbutrin 100mg  once daily - update me in 2 weeks with effect. Return in 4-6 weeks for follow up visit on mood.  Good to see you today, call us with questions.  Follow up plan: Return in about 4 weeks (around 04/19/2019) for follow up visit.  Ria Bush, MD

## 2019-03-23 DIAGNOSIS — Z0001 Encounter for general adult medical examination with abnormal findings: Secondary | ICD-10-CM | POA: Insufficient documentation

## 2019-03-23 DIAGNOSIS — F338 Other recurrent depressive disorders: Secondary | ICD-10-CM | POA: Insufficient documentation

## 2019-03-23 MED ORDER — VALACYCLOVIR HCL 1 G PO TABS: 1000.0000 mg | ORAL_TABLET | Freq: Three times a day (TID) | ORAL | 0 refills | Status: AC

## 2019-03-23 NOTE — Assessment & Plan Note (Signed)
Preventative protocols reviewed and updated unless pt declined. Discussed healthy diet and lifestyle.  

## 2019-03-23 NOTE — Assessment & Plan Note (Addendum)
Endorses longstanding history of struggling in winter season suggesting of seasonal affective disorder. Also endorses decreased sex drive since complete hysterectomy 2018 which contributes to depression - suggested she return to OBGYN to explore treatment options.  Not interested in counseling at this time. Interested in medication trial - will start wellbutrin SL 100mg  once daily for the next month, RTC 1 mo f/u visit. Reviewed possible increased suicidality.

## 2019-03-23 NOTE — Assessment & Plan Note (Signed)
L thyroid gland enlargement noted today. No h/o hypothyroidism, TSH levels remain stable. Will update thyroid US. Endorses h/o thyroid biopsy vs cyst aspiration in the past by ENT Tami Ribas) - will request records and consider return pending [place] results. Pt agrees with plan.

## 2019-04-03 ENCOUNTER — Other Ambulatory Visit: Payer: Self-pay | Admitting: Unknown Physician Specialty

## 2019-04-03 DIAGNOSIS — E041 Nontoxic single thyroid nodule: Secondary | ICD-10-CM

## 2019-04-10 ENCOUNTER — Other Ambulatory Visit: Payer: Self-pay

## 2019-04-10 ENCOUNTER — Ambulatory Visit: Payer: BC Managed Care – PPO

## 2019-04-10 ENCOUNTER — Ambulatory Visit: Admission: RE | Admit: 2019-04-10 | Payer: BC Managed Care – PPO | Source: Ambulatory Visit

## 2019-05-05 DIAGNOSIS — X32XXXA Exposure to sunlight, initial encounter: Secondary | ICD-10-CM | POA: Diagnosis not present

## 2019-05-05 DIAGNOSIS — D2262 Melanocytic nevi of left upper limb, including shoulder: Secondary | ICD-10-CM | POA: Diagnosis not present

## 2019-05-05 DIAGNOSIS — L570 Actinic keratosis: Secondary | ICD-10-CM | POA: Diagnosis not present

## 2019-05-05 DIAGNOSIS — D2271 Melanocytic nevi of right lower limb, including hip: Secondary | ICD-10-CM | POA: Diagnosis not present

## 2019-05-05 DIAGNOSIS — D2261 Melanocytic nevi of right upper limb, including shoulder: Secondary | ICD-10-CM | POA: Diagnosis not present

## 2019-05-05 DIAGNOSIS — Z85828 Personal history of other malignant neoplasm of skin: Secondary | ICD-10-CM | POA: Diagnosis not present

## 2019-05-23 DIAGNOSIS — Z20828 Contact with and (suspected) exposure to other viral communicable diseases: Secondary | ICD-10-CM | POA: Diagnosis not present

## 2019-05-23 DIAGNOSIS — Z20822 Contact with and (suspected) exposure to covid-19: Secondary | ICD-10-CM | POA: Diagnosis not present

## 2019-06-26 ENCOUNTER — Encounter: Payer: Self-pay | Admitting: Family Medicine

## 2019-06-27 NOTE — Telephone Encounter (Signed)
Noted  

## 2019-07-03 DIAGNOSIS — E041 Nontoxic single thyroid nodule: Secondary | ICD-10-CM | POA: Diagnosis not present

## 2019-07-07 ENCOUNTER — Other Ambulatory Visit: Payer: Self-pay | Admitting: Family Medicine

## 2019-07-07 NOTE — Telephone Encounter (Signed)
Lvm asking pt to call back.  Per Dr. Darnell Level (see OV, 03/22/19), pt was to have a 4 wk mood f/u.

## 2019-07-31 DIAGNOSIS — E042 Nontoxic multinodular goiter: Secondary | ICD-10-CM | POA: Diagnosis not present

## 2019-08-04 DIAGNOSIS — E042 Nontoxic multinodular goiter: Secondary | ICD-10-CM | POA: Diagnosis not present

## 2019-08-04 DIAGNOSIS — M542 Cervicalgia: Secondary | ICD-10-CM | POA: Diagnosis not present

## 2019-08-23 DIAGNOSIS — E042 Nontoxic multinodular goiter: Secondary | ICD-10-CM | POA: Diagnosis not present

## 2019-08-24 DIAGNOSIS — E042 Nontoxic multinodular goiter: Secondary | ICD-10-CM | POA: Diagnosis not present

## 2019-11-13 ENCOUNTER — Other Ambulatory Visit: Payer: Self-pay | Admitting: Family Medicine

## 2020-03-27 ENCOUNTER — Encounter: Payer: Self-pay | Admitting: Family Medicine

## 2020-03-28 ENCOUNTER — Ambulatory Visit: Payer: BC Managed Care – PPO | Admitting: Nurse Practitioner

## 2020-03-28 ENCOUNTER — Encounter: Payer: Self-pay | Admitting: Nurse Practitioner

## 2020-03-28 ENCOUNTER — Other Ambulatory Visit: Payer: Self-pay

## 2020-03-28 VITALS — BP 130/82 | HR 74 | Temp 98.0°F | Ht 67.5 in | Wt 152.0 lb

## 2020-03-28 DIAGNOSIS — R300 Dysuria: Secondary | ICD-10-CM

## 2020-03-28 LAB — POCT URINALYSIS DIPSTICK
Bilirubin, UA: NEGATIVE
Blood, UA: NEGATIVE
Glucose, UA: POSITIVE — AB
Nitrite, UA: POSITIVE
Protein, UA: NEGATIVE
Spec Grav, UA: 1.01 (ref 1.010–1.025)
Urobilinogen, UA: 2 E.U./dL — AB
pH, UA: 6.5 (ref 5.0–8.0)

## 2020-03-28 MED ORDER — NITROFURANTOIN MONOHYD MACRO 100 MG PO CAPS
100.0000 mg | ORAL_CAPSULE | Freq: Two times a day (BID) | ORAL | 0 refills | Status: DC
Start: 2020-03-28 — End: 2020-05-03

## 2020-03-28 MED ORDER — PHENAZOPYRIDINE HCL 200 MG PO TABS: 200.0000 mg | ORAL_TABLET | Freq: Three times a day (TID) | ORAL | 0 refills | Status: AC | PRN

## 2020-03-28 NOTE — Progress Notes (Signed)
Established Patient Office Visit  Subjective:  Patient ID: Karen Rodriguez, female    DOB: Aug 02, 1965  Age: 54 y.o. MRN: [id number]  CC:  Chief Complaint  Patient presents with  . Acute Visit    UTI    HPI Karen Rodriguez presents for a 3-day history of urinary burning, frequency and urgency.  She took Azo for 2 days and has been drinking cranberry juice without resolution.  Patient has noted no fevers or chills, nausea vomiting.  She has noted no flank pain.  She does have some mild chronic lower back pain but that is unchanged from baseline.  She does not feel systemically ill.  Tolerating diet well.  Patient has only had a couple of urinary tract infections over the last 10 years.  She denies any vaginal complaints.  Past Medical History:  Diagnosis Date  . Anxiety state, unspecified   . Chest pain, unspecified 1997   negative cardiolyte and echo  . Diverticulitis 2009, 2012   dx by CT x 2 (2009, 2012).  never had colonoscopy done.  . Multinodular goiter   . Panic attacks   . Recurrent sinusitis   . Seasonal allergies   . Squamous cell skin cancer   . Subclinical hyperthyroidism 2007  . Teratoma of left ovary 2017   s/p hysterectomy with LSO    Past Surgical History:  Procedure Laterality Date  . ABDOMINAL HYSTERECTOMY    . AUGMENTATION MAMMAPLASTY Bilateral   . BREAST BIOPSY Left 1996   benign palpable mass excised.  Marland Kitchen BREAST BIOPSY Left 04/15/2016   LEFT BREAST, 11 O'CLOCK, FINE NEEDLE ASPIRATION:  . BREAST CYST ASPIRATION Left 2017  . BREAST ENHANCEMENT SURGERY  1988   Dr. Cathie Hoops; then revised in [facility] ([place])  . cardiolite  1997   normal, negative echo (Dr. Enzo Bi)  . COLONOSCOPY WITH PROPOFOL N/A 08/03/2017   HP, rpt 10 yrs Allen Norris, Darren, MD)  . CT ABD W & PELVIS WO CM  03/2008   splenomegaly, sigmoid diverticulitis, 2cm dermoid L ovary, constipation  . CT ABD W & PELVIS WO CM  09/2010   acute focal uncomplicated upper sigmoid diverticulosis, dermoid  cyst L ovary, constipation  . ESOPHAGOGASTRODUODENOSCOPY (EGD) WITH PROPOFOL N/A 08/03/2017   WNL - Lucilla Lame, MD  . FINE NEEDLE ASPIRATION Right 11/2012   thyroid nodule - benign follicular cells consistent with colloid nodule Tami Ribas)  . LAPAROSCOPIC VAGINAL HYSTERECTOMY WITH SALPINGO OOPHORECTOMY Bilateral 01/06/2016   LAPAROSCOPIC ASSISTED VAGINAL HYSTERECTOMY WITH BILATERAL SALPINGO OOPHORECTOMY;  Rubie Maid, MD  . nsvd     x2  . tear duct surgery  06/1998   Right  . thyroid uptake scan  2007   early hyperthyroidism, suggestive early toxic adenomas of left lobe ([place])  . thyroid US  2007   benign cyst - R lobe 55mm, 1.1cm mid post left lobe, 1.2 cm mid left lobe  . TONSILLECTOMY      Family History  Problem Relation Age of Onset  . Breast cancer Mother 79  . Hypertension Mother   . Stroke Mother 53  . Other Father        CCY  . Breast cancer Cousin   . Stroke Other        GM    Social History   Socioeconomic History  . Marital status: Married    Spouse name: Not on file  . Number of children: 2  . Years of education: Not on file  . Highest education level: Not  on file  Occupational History  . Occupation: Glass blower/designer  Tobacco Use  . Smoking status: Never Smoker  . Smokeless tobacco: Never Used  Substance and Sexual Activity  . Alcohol use: Yes    Alcohol/week: 3.0 - 4.0 standard drinks    Types: 3 - 4 Glasses of wine per week    Comment: Glass of wine occasionally  . Drug use: No  . Sexual activity: Yes    Birth control/protection: None    Comment: Husband had vasectomcy 1996  Other Topics Concern  . Not on file  Social History Narrative   Married; lives with husband      2 children      Insurance underwriter   Social Determinants of Health   Financial Resource Strain:   . Difficulty of Paying Living Expenses: Not on file  Food Insecurity:   . Worried About Charity fundraiser in the Last Year: Not on file  .  Ran Out of Food in the Last Year: Not on file  Transportation Needs:   . Lack of Transportation (Medical): Not on file  . Lack of Transportation (Non-Medical): Not on file  Physical Activity:   . Days of Exercise per Week: Not on file  . Minutes of Exercise per Session: Not on file  Stress:   . Feeling of Stress : Not on file  Social Connections:   . Frequency of Communication with Friends and Family: Not on file  . Frequency of Social Gatherings with Friends and Family: Not on file  . Attends Religious Services: Not on file  . Active Member of Clubs or Organizations: Not on file  . Attends Archivist Meetings: Not on file  . Marital Status: Not on file  Intimate Partner Violence:   . Fear of Current or Ex-Partner: Not on file  . Emotionally Abused: Not on file  . Physically Abused: Not on file  . Sexually Abused: Not on file    Outpatient Medications Prior to Visit  Medication Sig Dispense Refill  . buPROPion (WELLBUTRIN SR) 100 MG 12 hr tablet TAKE 1 TABLET BY MOUTH EVERY DAY 30 tablet 4  . valACYclovir (VALTREX) 1000 MG tablet Take 1 tablet (1,000 mg total) by mouth 3 (three) times daily. 21 tablet 0   No facility-administered medications prior to visit.    Allergies  Allergen Reactions  . Azithromycin     REACTION: UNSPECIFIED  . Celecoxib     REACTION: SWELLING  . Codeine Rash and Other (See Comments)    Upset stomach    Review of Systems Pertinent positives noted in history of present illness and otherwise negative.   Objective:    Physical Exam Vitals reviewed.  Constitutional:      Appearance: She is normal weight.  HENT:     Head: Normocephalic and atraumatic.  Cardiovascular:     Rate and Rhythm: Normal rate and regular rhythm.     Pulses: Normal pulses.     Heart sounds: Normal heart sounds.  Pulmonary:     Effort: Pulmonary effort is normal.     Breath sounds: Normal breath sounds.  Abdominal:     Palpations: Abdomen is soft.      Tenderness: There is no abdominal tenderness. There is no right CVA tenderness or left CVA tenderness.     Comments: Mild bladder discomfort with palpation.   Musculoskeletal:        General: Normal range of motion.  Neurological:     General: No focal deficit present.     Mental Status: She is alert and oriented to person, place, and time.  Psychiatric:        Mood and Affect: Mood normal.        Behavior: Behavior normal.     BP 130/82 (BP Location: Left Arm, Patient Position: Sitting, Cuff Size: Normal)   Pulse 74   Temp 98 F (36.7 C) (Oral)   Ht 5' 7.5" (1.715 m)   Wt 152 lb (68.9 kg)   LMP 12/17/2015 (Exact Date)   SpO2 98%   BMI 23.46 kg/m  Wt Readings from Last 3 Encounters:  03/28/20 152 lb (68.9 kg)  03/22/19 161 lb 1 oz (73.1 kg)  11/09/17 154 lb (69.9 kg)   Pulse Readings from Last 3 Encounters:  03/28/20 74  03/22/19 79  11/09/17 82    BP Readings from Last 3 Encounters:  03/28/20 130/82  03/22/19 124/76  11/09/17 124/78    Lab Results  Component Value Date   CHOL 184 03/15/2019   HDL 70.60 03/15/2019   LDLCALC 94 03/15/2019   TRIG 98.0 03/15/2019   CHOLHDL 3 03/15/2019      Health Maintenance Due  Topic Date Due  . Hepatitis C Screening  Never done  . HIV Screening  Never done  . TETANUS/TDAP  Never done  . PAP SMEAR-Modifier  02/16/2008  . INFLUENZA VACCINE  Never done    There are no preventive care reminders to display for this patient.  Lab Results  Component Value Date   TSH 0.44 03/15/2019   Lab Results  Component Value Date   WBC 5.2 06/29/2017   HGB 14.1 06/29/2017   HCT 41.4 06/29/2017   MCV 87.8 06/29/2017   PLT 253 06/29/2017   Lab Results  Component Value Date   NA 141 03/15/2019   K 4.4 03/15/2019   CO2 30 03/15/2019   GLUCOSE 101 (H) 03/15/2019   BUN 17 03/15/2019   CREATININE 0.67 03/15/2019   BILITOT 0.8 03/15/2019   ALKPHOS 70 03/15/2019   AST 20 03/15/2019   ALT 20 03/15/2019   PROT 6.9 03/15/2019    ALBUMIN 4.7 03/15/2019   CALCIUM 9.7 03/15/2019   ANIONGAP 7 06/29/2017   GFR 91.95 03/15/2019   Lab Results  Component Value Date   CHOL 184 03/15/2019   Lab Results  Component Value Date   HDL 70.60 03/15/2019   Lab Results  Component Value Date   LDLCALC 94 03/15/2019   Lab Results  Component Value Date   TRIG 98.0 03/15/2019   Lab Results  Component Value Date   CHOLHDL 3 03/15/2019   No results found for: HGBA1C    Assessment & Plan:   Problem List Items Addressed This Visit      Other   Dysuria - Primary   Relevant Orders   POCT Urinalysis Dipstick (Completed)   Urine Culture   Urine Microscopic Only     Pt was advised:   Pyridium 200 mg to take x 3 per day for pain as needed after meals for only 2 days.   Antibiotic nitrofurantoin ( Macrobid) 100 mg to take twice a day for 5 days until we get the urine culture results back.   Hydrate well and void sodas, coffee/teas.   If symptoms do not improve in 2-3 days- please call the office for on-call provider advice [phone].  Contact a health care provider if:  You have a fever.  You develop pain in your back or sides.  You have nausea or vomiting.  You have blood in your urine.  You are not urinating as often as you usually do. Get help right away if:  Your pain is severe and not relieved with medicines.  You cannot eat or drink without vomiting.  You are confused.  You have a rapid heartbeat while at rest.  You have shaking or chills. You feel extremely weak  Follow-up: Return if symptoms worsen or fail to improve.  This visit occurred during the SARS-CoV-2 public health emergency.  Safety protocols were in place, including screening questions prior to the visit, additional usage of staff PPE, and extensive cleaning of exam room while observing appropriate contact time as indicated for disinfecting solutions.    Denice Paradise, NP

## 2020-03-28 NOTE — Patient Instructions (Addendum)
Pyridium 200 mg to take x 3 per day for pain as needed after meals for only 2 days.   Antibiotic nitrofurantoin ( Macrobid) 100 mg to take twice a day for 5 days until we get the urine culture results back.   Hydrate well and void sodas, coffee/teas.   If symptoms do not improve in 2-3 days- please call the office for on-call provider advice [phone].  Contact a health care provider if:  You have a fever.  You develop pain in your back or sides.  You have nausea or vomiting.  You have blood in your urine.  You are not urinating as often as you usually do. Get help right away if:  Your pain is severe and not relieved with medicines.  You cannot eat or drink without vomiting.  You are confused.  You have a rapid heartbeat while at rest.  You have shaking or chills.  You feel extremely weak.   Dysuria Dysuria is pain or discomfort while urinating. The pain or discomfort may be felt in the part of your body that drains urine from the bladder (urethra) or in the surrounding tissue of the genitals. The pain may also be felt in the groin area, lower abdomen, or lower back. You may have to urinate frequently or have the sudden feeling that you have to urinate (urgency). Dysuria can affect both men and women, but it is more common in women. Dysuria can be caused by many different things, including:  Urinary tract infection.  Kidney stones or bladder stones.  Certain sexually transmitted infections (STIs), such as chlamydia.  Dehydration.  Inflammation of the tissues of the vagina.  Use of certain medicines.  Use of certain soaps or scented products that cause irritation. Follow these instructions at home: General instructions  Watch your condition for any changes.  Urinate often. Avoid holding urine for long periods of time.  After a bowel movement or urination, women should cleanse from front to back, using each tissue only once.  Urinate after sexual  intercourse.  Keep all follow-up visits as told by your health care provider. This is important.  If you had any tests done to find the cause of dysuria, it is up to you to get your test results. Ask your health care provider, or the department that is doing the test, when your results will be ready. Eating and drinking   Drink enough fluid to keep your urine pale yellow.  Avoid caffeine, tea, and alcohol. They can irritate the bladder and make dysuria worse. In men, alcohol may irritate the prostate. Medicines  Take over-the-counter and prescription medicines only as told by your health care provider.  If you were prescribed an antibiotic medicine, take it as told by your health care provider. Do not stop taking the antibiotic even if you start to feel better. Contact a health care provider if:  You have a fever.  You develop pain in your back or sides.  You have nausea or vomiting.  You have blood in your urine.  You are not urinating as often as you usually do. Get help right away if:  Your pain is severe and not relieved with medicines.  You cannot eat or drink without vomiting.  You are confused.  You have a rapid heartbeat while at rest.  You have shaking or chills.  You feel extremely weak. Summary  Dysuria is pain or discomfort while urinating. Many different conditions can lead to dysuria.  If you have dysuria,  you may have to urinate frequently or have the sudden feeling that you have to urinate (urgency).  Watch your condition for any changes. Keep all follow-up visits as told by your health care provider.  Make sure that you urinate often and drink enough fluid to keep your urine pale yellow. This information is not intended to replace advice given to you by your health care provider. Make sure you discuss any questions you have with your health care provider. Document Revised: 04/16/2017 Document Reviewed: 02/18/2017 Elsevier Patient Education  [place].

## 2020-03-29 LAB — URINALYSIS, MICROSCOPIC ONLY

## 2020-03-30 LAB — URINE CULTURE
MICRO NUMBER:: 11191595
SPECIMEN QUALITY:: ADEQUATE

## 2020-04-09 DIAGNOSIS — U071 COVID-19: Secondary | ICD-10-CM | POA: Diagnosis not present

## 2020-04-09 DIAGNOSIS — B20 Human immunodeficiency virus [HIV] disease: Secondary | ICD-10-CM | POA: Diagnosis not present

## 2020-04-15 DIAGNOSIS — U071 COVID-19: Secondary | ICD-10-CM | POA: Diagnosis not present

## 2020-04-18 DIAGNOSIS — L570 Actinic keratosis: Secondary | ICD-10-CM | POA: Diagnosis not present

## 2020-04-18 DIAGNOSIS — X32XXXA Exposure to sunlight, initial encounter: Secondary | ICD-10-CM | POA: Diagnosis not present

## 2020-04-18 DIAGNOSIS — D2262 Melanocytic nevi of left upper limb, including shoulder: Secondary | ICD-10-CM | POA: Diagnosis not present

## 2020-04-18 DIAGNOSIS — Z85828 Personal history of other malignant neoplasm of skin: Secondary | ICD-10-CM | POA: Diagnosis not present

## 2020-04-18 DIAGNOSIS — D225 Melanocytic nevi of trunk: Secondary | ICD-10-CM | POA: Diagnosis not present

## 2020-04-18 DIAGNOSIS — D2261 Melanocytic nevi of right upper limb, including shoulder: Secondary | ICD-10-CM | POA: Diagnosis not present

## 2020-05-03 ENCOUNTER — Encounter: Payer: Self-pay | Admitting: Family Medicine

## 2020-05-03 ENCOUNTER — Ambulatory Visit: Payer: BC Managed Care – PPO | Admitting: Family Medicine

## 2020-05-03 ENCOUNTER — Other Ambulatory Visit: Payer: Self-pay

## 2020-05-03 VITALS — BP 136/80 | HR 78 | Temp 97.6°F | Ht 67.5 in | Wt 153.1 lb

## 2020-05-03 DIAGNOSIS — M5416 Radiculopathy, lumbar region: Secondary | ICD-10-CM

## 2020-05-03 MED ORDER — PREDNISONE 20 MG PO TABS: ORAL_TABLET | ORAL | 0 refills | Status: AC

## 2020-05-03 MED ORDER — METHOCARBAMOL 500 MG PO TABS: 500.0000 mg | ORAL_TABLET | Freq: Four times a day (QID) | ORAL | 0 refills | Status: AC | PRN

## 2020-05-03 MED ORDER — TRAMADOL HCL 50 MG PO TABS
25.0000 mg | ORAL_TABLET | Freq: Two times a day (BID) | ORAL | 0 refills | Status: AC | PRN
Start: 2020-05-03 — End: 2020-05-08

## 2020-05-03 NOTE — Patient Instructions (Addendum)
I am also suspicious for pinched nerve from herniated disc.  Prednisone course, muscle relaxant, tramadol for breakthrough pain.  Update [place] on Monday with effect. I will see if we can get MRI done prior to PM&R appointment.

## 2020-05-03 NOTE — Progress Notes (Signed)
Patient ID: Karen Rodriguez, female    DOB: Mar 22, 1966, 54 y.o.   MRN: [id number]  This visit was conducted in person.  BP 136/80 (BP Location: Left Arm, Patient Position: Sitting, Cuff Size: Normal)   Pulse 78   Temp 97.6 F (36.4 C) (Temporal)   Ht 5' 7.5" (1.715 m)   Wt 153 lb 2 oz (69.5 kg)   LMP 12/17/2015 (Exact Date)   SpO2 98%   BMI 23.63 kg/m    CC: back pain  Subjective:   HPI: Karen Rodriguez is a 54 y.o. female presenting on 05/03/2020 for Back Pain (C/o pinched nerve between L4/L5.  Injured back around 04/14/20.  Seen by chiropractor.  Has appt with [facility] on 05/22/19.  Suggested she see PCP for pain med until visit. )   Quarantined with COVID over thanksgiving - she did remarkably well with minimal symptoms.   DOI: ~04/14/20 Went to flip over new large air hockey table - felt L ribs pop (now resolved) and lower back pain. Persistent severe L lower back with radiation down buttock/posterior leg. Notes numbness and weakness of left leg.  No bowel/bladder incontinence, saddle anesthesia, fevers/chills.   Has seen chiropractor (Dr Emmit Pomfret) multiple times since injury for treatments - xray showed L4/5 pinched nerve.  Using TENS unit, using lumbar brace for support, using aleve/ibuprofen and CBD oil. No significant improvement. Actually worsening.   Upcoming PM&R appt 05/21/2020.   No prior h/o back surgeries.  Remote tailbone fracture.   Very stressed because she feels disabled during this busy Christmas season.      Relevant past medical, surgical, family and social history reviewed and updated as indicated. Interim medical history since our last visit reviewed. Allergies and medications reviewed and updated. Outpatient Medications Prior to Visit  Medication Sig Dispense Refill  . buPROPion (WELLBUTRIN SR) 100 MG 12 hr tablet TAKE 1 TABLET BY MOUTH EVERY DAY 30 tablet 4  . valACYclovir (VALTREX) 1000 MG tablet Take 1 tablet (1,000 mg total) by mouth  3 (three) times daily. 21 tablet 0  . nitrofurantoin, macrocrystal-monohydrate, (MACROBID) 100 MG capsule Take 1 capsule (100 mg total) by mouth 2 (two) times daily. Take with food. 10 capsule 0   No facility-administered medications prior to visit.     Per HPI unless specifically indicated in ROS section below Review of Systems Objective:  BP 136/80 (BP Location: Left Arm, Patient Position: Sitting, Cuff Size: Normal)   Pulse 78   Temp 97.6 F (36.4 C) (Temporal)   Ht 5' 7.5" (1.715 m)   Wt 153 lb 2 oz (69.5 kg)   LMP 12/17/2015 (Exact Date)   SpO2 98%   BMI 23.63 kg/m   Wt Readings from Last 3 Encounters:  05/03/20 153 lb 2 oz (69.5 kg)  03/28/20 152 lb (68.9 kg)  03/22/19 161 lb 1 oz (73.1 kg)      Physical Exam Vitals and nursing note reviewed.  Constitutional:      Appearance: Normal appearance. She is not ill-appearing.     Comments: Tears from pain   Musculoskeletal:     Comments:  Discomfort midline lower lumbar spine Bilateral lumbar paraspinous mm tenderness ++ SLR on left No pain with int/ext rotation at hip.  Skin:    General: Skin is warm and dry.     Findings: No rash.  Neurological:     Mental Status: She is alert.     Sensory: Sensation is intact.  Motor: Weakness present.     Deep Tendon Reflexes:     Reflex Scores:      Patellar reflexes are 0 on the right side and 0 on the left side.      Achilles reflexes are 0 on the right side and 0 on the left side.    Comments:  Diminished strength LLE at hip flexors, knee flexors, dorsi and plantar flexion Diminished DTRs bilateral LE Difficulty with heel and toe walk due to pain Sensation grossly intact to light touch       Assessment & Plan:  This visit occurred during the SARS-CoV-2 public health emergency.  Safety protocols were in place, including screening questions prior to the visit, additional usage of staff PPE, and extensive cleaning of exam room while observing appropriate contact time as  indicated for disinfecting solutions.   Problem List Items Addressed This Visit    Acute left lumbar radiculopathy - Primary    Reviewed imaging she brings, asked to scan into our system - mild scoliosis, no evident compression fracture, some decreased disc space height L4/5 and L5/S1.  Story/exam consistent with L lumbar radiculitis with radiculopathy - with neurological changes (L leg/foot weakness) and progressive pain. Failed conservative management with chiropractor care, TENS unit, lumbar brace, and NSAIDs/tylenol. Will Rx prednisone course, robaxin muscle relaxant, tramadol for breakthrough pain (steroid and sedation precautions reviewed). I do recommend further evaluation with lumbar MRI to evaluate for compression. Will try to schedule prior to upcoming PM&R eval next month.       Relevant Medications   methocarbamol (ROBAXIN) 500 MG tablet   Other Relevant Orders   MR Lumbar Spine Wo Contrast       Meds ordered this encounter  Medications  . predniSONE (DELTASONE) 20 MG tablet    Sig: Take two tablets daily for 4 days followed by one tablet daily for 4 days    Dispense:  12 tablet    Refill:  0  . methocarbamol (ROBAXIN) 500 MG tablet    Sig: Take 1 tablet (500 mg total) by mouth 4 (four) times daily as needed for muscle spasms (sedation precautions).    Dispense:  40 tablet    Refill:  0  . traMADol (ULTRAM) 50 MG tablet    Sig: Take 0.5-1 tablets (25-50 mg total) by mouth 2 (two) times daily as needed for up to 5 days.    Dispense:  10 tablet    Refill:  0   Orders Placed This Encounter  Procedures  . MR Lumbar Spine Wo Contrast    Standing Status:   Future    Standing Expiration Date:   05/03/2021    Order Specific Question:   What is the patient's sedation requirement?    Answer:   No Sedation    Order Specific Question:   Does the patient have a pacemaker or implanted devices?    Answer:   No    Order Specific Question:   Preferred imaging location?    Answer:    [facility] (table limit-350lbs)    Patient Instructions  I am also suspicious for pinched nerve from herniated disc.  Prednisone course, muscle relaxant, tramadol for breakthrough pain.  Update [place] on Monday with effect. I will see if we can get MRI done prior to PM&R appointment.    Follow up plan: Return if symptoms worsen or fail to improve.  Ria Bush, MD

## 2020-05-04 DIAGNOSIS — M5416 Radiculopathy, lumbar region: Secondary | ICD-10-CM | POA: Insufficient documentation

## 2020-05-04 NOTE — Assessment & Plan Note (Addendum)
Reviewed imaging she brings, asked to scan into our system - mild scoliosis, no evident compression fracture, some decreased disc space height L4/5 and L5/S1.  Story/exam consistent with L lumbar radiculitis with radiculopathy - with neurological changes (L leg/foot weakness) and progressive pain. Failed conservative management with chiropractor care, TENS unit, lumbar brace, and NSAIDs/tylenol. Will Rx prednisone course, robaxin muscle relaxant, tramadol for breakthrough pain (steroid and sedation precautions reviewed). I do recommend further evaluation with lumbar MRI to evaluate for compression. Will try to schedule prior to upcoming PM&R eval next month.

## 2020-05-09 ENCOUNTER — Other Ambulatory Visit: Payer: Self-pay | Admitting: Family Medicine

## 2020-05-09 NOTE — Telephone Encounter (Signed)
E-scribed refill.  Plz schedule lab and cpe visits.  

## 2020-05-09 NOTE — Telephone Encounter (Signed)
Spoke with patient and she was driving will call back to schedule when she gets to her destination. Advised office closes at 2  And reopens on Monday the 27th

## 2020-05-14 ENCOUNTER — Ambulatory Visit: Admission: RE | Admit: 2020-05-14 | Payer: BC Managed Care – PPO | Source: Ambulatory Visit

## 2020-05-15 NOTE — Telephone Encounter (Signed)
Left detailed message (MY 2nd call) to patient. Will mail letter. EM

## 2020-06-21 DIAGNOSIS — E042 Nontoxic multinodular goiter: Secondary | ICD-10-CM | POA: Diagnosis not present

## 2021-02-25 DIAGNOSIS — D0439 Carcinoma in situ of skin of other parts of face: Secondary | ICD-10-CM | POA: Diagnosis not present

## 2021-02-25 DIAGNOSIS — D485 Neoplasm of uncertain behavior of skin: Secondary | ICD-10-CM | POA: Diagnosis not present

## 2021-06-17 DIAGNOSIS — Z13220 Encounter for screening for lipoid disorders: Secondary | ICD-10-CM | POA: Diagnosis not present

## 2021-06-17 DIAGNOSIS — Z7689 Persons encountering health services in other specified circumstances: Secondary | ICD-10-CM | POA: Diagnosis not present

## 2021-06-17 DIAGNOSIS — Z780 Asymptomatic menopausal state: Secondary | ICD-10-CM | POA: Diagnosis not present

## 2021-06-17 DIAGNOSIS — Z131 Encounter for screening for diabetes mellitus: Secondary | ICD-10-CM | POA: Diagnosis not present

## 2021-06-17 DIAGNOSIS — R946 Abnormal results of thyroid function studies: Secondary | ICD-10-CM | POA: Diagnosis not present

## 2021-06-17 DIAGNOSIS — Z0000 Encounter for general adult medical examination without abnormal findings: Secondary | ICD-10-CM | POA: Diagnosis not present

## 2021-06-17 DIAGNOSIS — E042 Nontoxic multinodular goiter: Secondary | ICD-10-CM | POA: Diagnosis not present

## 2021-06-19 ENCOUNTER — Other Ambulatory Visit: Payer: Self-pay | Admitting: Internal Medicine

## 2021-06-20 ENCOUNTER — Other Ambulatory Visit: Payer: Self-pay | Admitting: Internal Medicine

## 2021-06-20 DIAGNOSIS — Z1231 Encounter for screening mammogram for malignant neoplasm of breast: Secondary | ICD-10-CM

## 2021-07-10 DIAGNOSIS — D0439 Carcinoma in situ of skin of other parts of face: Secondary | ICD-10-CM | POA: Diagnosis not present

## 2021-07-15 ENCOUNTER — Ambulatory Visit
Admission: RE | Admit: 2021-07-15 | Discharge: 2021-07-15 | Disposition: A | Discharge status: Home / Self Care | Payer: BC Managed Care – PPO | Source: Ambulatory Visit | Attending: Internal Medicine | Admitting: Internal Medicine

## 2021-07-15 ENCOUNTER — Other Ambulatory Visit: Payer: Self-pay

## 2021-07-15 DIAGNOSIS — Z1231 Encounter for screening mammogram for malignant neoplasm of breast: Secondary | ICD-10-CM | POA: Diagnosis not present

## 2021-07-16 DIAGNOSIS — K5792 Diverticulitis of intestine, part unspecified, without perforation or abscess without bleeding: Secondary | ICD-10-CM | POA: Diagnosis not present

## 2021-07-18 ENCOUNTER — Other Ambulatory Visit: Payer: Self-pay | Admitting: Student

## 2021-07-18 ENCOUNTER — Other Ambulatory Visit: Payer: Self-pay | Admitting: Internal Medicine

## 2021-07-18 DIAGNOSIS — N63 Unspecified lump in unspecified breast: Secondary | ICD-10-CM

## 2021-07-18 DIAGNOSIS — R928 Other abnormal and inconclusive findings on diagnostic imaging of breast: Secondary | ICD-10-CM

## 2021-08-04 ENCOUNTER — Ambulatory Visit
Admission: RE | Admit: 2021-08-04 | Discharge: 2021-08-04 | Disposition: A | Discharge status: Home / Self Care | Payer: BC Managed Care – PPO | Source: Ambulatory Visit | Attending: Internal Medicine | Admitting: Internal Medicine

## 2021-08-04 ENCOUNTER — Other Ambulatory Visit: Payer: Self-pay

## 2021-08-04 DIAGNOSIS — N63 Unspecified lump in unspecified breast: Secondary | ICD-10-CM | POA: Insufficient documentation

## 2021-08-04 DIAGNOSIS — R928 Other abnormal and inconclusive findings on diagnostic imaging of breast: Secondary | ICD-10-CM | POA: Insufficient documentation

## 2021-08-04 DIAGNOSIS — N6002 Solitary cyst of left breast: Secondary | ICD-10-CM | POA: Diagnosis not present

## 2021-08-04 DIAGNOSIS — N6320 Unspecified lump in the left breast, unspecified quadrant: Secondary | ICD-10-CM | POA: Diagnosis not present

## 2021-08-04 DIAGNOSIS — R922 Inconclusive mammogram: Secondary | ICD-10-CM | POA: Diagnosis not present

## 2021-11-13 DIAGNOSIS — L570 Actinic keratosis: Secondary | ICD-10-CM | POA: Diagnosis not present

## 2021-11-13 DIAGNOSIS — Z85828 Personal history of other malignant neoplasm of skin: Secondary | ICD-10-CM | POA: Diagnosis not present

## 2021-11-13 DIAGNOSIS — D2272 Melanocytic nevi of left lower limb, including hip: Secondary | ICD-10-CM | POA: Diagnosis not present

## 2021-11-13 DIAGNOSIS — D0462 Carcinoma in situ of skin of left upper limb, including shoulder: Secondary | ICD-10-CM | POA: Diagnosis not present

## 2021-11-13 DIAGNOSIS — D2261 Melanocytic nevi of right upper limb, including shoulder: Secondary | ICD-10-CM | POA: Diagnosis not present

## 2021-11-13 DIAGNOSIS — L439 Lichen planus, unspecified: Secondary | ICD-10-CM | POA: Diagnosis not present

## 2021-11-13 DIAGNOSIS — D485 Neoplasm of uncertain behavior of skin: Secondary | ICD-10-CM | POA: Diagnosis not present

## 2021-11-13 DIAGNOSIS — D225 Melanocytic nevi of trunk: Secondary | ICD-10-CM | POA: Diagnosis not present

## 2022-02-18 DIAGNOSIS — D0462 Carcinoma in situ of skin of left upper limb, including shoulder: Secondary | ICD-10-CM | POA: Diagnosis not present

## 2022-04-01 DIAGNOSIS — E042 Nontoxic multinodular goiter: Secondary | ICD-10-CM | POA: Diagnosis not present

## 2022-04-29 ENCOUNTER — Encounter: Payer: Self-pay | Admitting: Internal Medicine

## 2022-04-29 ENCOUNTER — Other Ambulatory Visit: Payer: Self-pay | Admitting: Internal Medicine

## 2022-04-29 DIAGNOSIS — K5792 Diverticulitis of intestine, part unspecified, without perforation or abscess without bleeding: Secondary | ICD-10-CM

## 2022-04-29 DIAGNOSIS — R1032 Left lower quadrant pain: Secondary | ICD-10-CM

## 2022-04-30 ENCOUNTER — Ambulatory Visit
Admission: RE | Admit: 2022-04-30 | Discharge: 2022-04-30 | Disposition: A | Discharge status: Home / Self Care | Payer: BC Managed Care – PPO | Source: Ambulatory Visit | Attending: Internal Medicine | Admitting: Internal Medicine

## 2022-04-30 DIAGNOSIS — K6389 Other specified diseases of intestine: Secondary | ICD-10-CM | POA: Diagnosis not present

## 2022-04-30 DIAGNOSIS — K429 Umbilical hernia without obstruction or gangrene: Secondary | ICD-10-CM | POA: Diagnosis not present

## 2022-04-30 DIAGNOSIS — K449 Diaphragmatic hernia without obstruction or gangrene: Secondary | ICD-10-CM | POA: Diagnosis not present

## 2022-04-30 DIAGNOSIS — R1032 Left lower quadrant pain: Secondary | ICD-10-CM

## 2022-04-30 DIAGNOSIS — K5732 Diverticulitis of large intestine without perforation or abscess without bleeding: Secondary | ICD-10-CM | POA: Diagnosis not present

## 2022-04-30 MED ORDER — IOPAMIDOL (ISOVUE-300) INJECTION 61%
100.0000 mL | Freq: Once | INTRAVENOUS | Status: AC | PRN
  Administered 2022-04-30: 100 mL via INTRAVENOUS

## 2022-10-21 ENCOUNTER — Other Ambulatory Visit: Payer: Self-pay | Admitting: Internal Medicine

## 2022-10-21 DIAGNOSIS — Z1231 Encounter for screening mammogram for malignant neoplasm of breast: Secondary | ICD-10-CM

## 2022-11-03 ENCOUNTER — Ambulatory Visit
Admission: RE | Admit: 2022-11-03 | Discharge: 2022-11-03 | Disposition: A | Discharge status: Home / Self Care | Payer: BC Managed Care – PPO | Source: Ambulatory Visit | Attending: Internal Medicine | Admitting: Internal Medicine

## 2022-11-03 DIAGNOSIS — Z1231 Encounter for screening mammogram for malignant neoplasm of breast: Secondary | ICD-10-CM | POA: Insufficient documentation

## 2023-10-27 ENCOUNTER — Other Ambulatory Visit: Payer: Self-pay | Admitting: Internal Medicine

## 2023-10-27 DIAGNOSIS — Z1231 Encounter for screening mammogram for malignant neoplasm of breast: Secondary | ICD-10-CM

## 2023-11-15 ENCOUNTER — Ambulatory Visit
Admission: RE | Admit: 2023-11-15 | Discharge: 2023-11-15 | Disposition: A | Discharge status: Home / Self Care | Source: Ambulatory Visit | Attending: Internal Medicine | Admitting: Internal Medicine

## 2023-11-15 DIAGNOSIS — Z1231 Encounter for screening mammogram for malignant neoplasm of breast: Secondary | ICD-10-CM | POA: Insufficient documentation

## 2023-12-22 ENCOUNTER — Encounter: Payer: Self-pay | Admitting: Licensed Clinical Social Worker

## 2023-12-22 ENCOUNTER — Inpatient Hospital Stay

## 2023-12-22 ENCOUNTER — Inpatient Hospital Stay: Attending: Oncology | Admitting: Licensed Clinical Social Worker

## 2023-12-22 ENCOUNTER — Other Ambulatory Visit: Payer: Self-pay | Admitting: Licensed Clinical Social Worker

## 2023-12-22 ENCOUNTER — Telehealth: Payer: Self-pay | Admitting: Licensed Clinical Social Worker

## 2023-12-22 DIAGNOSIS — Z803 Family history of malignant neoplasm of breast: Secondary | ICD-10-CM | POA: Diagnosis present

## 2023-12-22 DIAGNOSIS — Z800 Family history of malignant neoplasm of digestive organs: Secondary | ICD-10-CM

## 2023-12-22 LAB — GENETIC SCREENING ORDER

## 2023-12-22 NOTE — Progress Notes (Signed)
 REFERRING PROVIDER: Self-referred  PRIMARY PROVIDER:  Sherial Bail, MD  PRIMARY REASON FOR VISIT:  1. Family history of Lynch syndrome   2. Family history of breast cancer      HISTORY OF PRESENT ILLNESS:   [name], a 58 y.o. female, was seen for a [facility] cancer genetics consultation  due to a family history of PMS2+ Lynch syndrome ([id number]) identified in her mother and sister.  [name] presents to clinic today to discuss the possibility of a hereditary predisposition to cancer, genetic testing, and to further clarify her future cancer risks, as well as potential cancer risks for family members.   CANCER HISTORY:  [name] is a 58 y.o. female with no personal history of cancer.    RELEVANT MEDICAL HISTORY:  Menarche was at age 41.  First live birth at age 79.  Ovaries intact: no.  Hysterectomy: yes.  Menopausal status: postmenopausal.  HRT use: 0 years. Colonoscopy: yes; normal. Mammogram within the last year: yes.  Past Medical History:  Diagnosis Date   Anxiety state, unspecified    Chest pain, unspecified 1997   negative cardiolyte and echo   Diverticulitis 2009, 2012   dx by CT x 2 (2009, 2012).  never had colonoscopy done.   Multinodular goiter    Panic attacks    Recurrent sinusitis    Seasonal allergies    Squamous cell skin cancer    Subclinical hyperthyroidism 2007   Teratoma of left ovary 2017   s/p hysterectomy with LSO    Past Surgical History:  Procedure Laterality Date   ABDOMINAL HYSTERECTOMY     AUGMENTATION MAMMAPLASTY Bilateral    BREAST BIOPSY Left 04/15/2016   LEFT BREAST, 11 O'CLOCK, FINE NEEDLE ASPIRATION:   BREAST CYST ASPIRATION Left 2017   BREAST ENHANCEMENT SURGERY  1988   Dr. Mamie; then revised in [facility] ([place])   BREAST EXCISIONAL BIOPSY Left    1996 benign   cardiolite  1997   normal, negative echo (Dr. Kathe)   COLONOSCOPY WITH PROPOFOL  N/A 08/03/2017   HP, rpt 10 yrs Renne, Darren, MD)   CT  ABD W & PELVIS WO CM  03/2008   splenomegaly, sigmoid diverticulitis, 2cm dermoid L ovary, constipation   CT ABD W & PELVIS WO CM  09/2010   acute focal uncomplicated upper sigmoid diverticulosis, dermoid cyst L ovary, constipation   ESOPHAGOGASTRODUODENOSCOPY (EGD) WITH PROPOFOL  N/A 08/03/2017   WNL - Jinny Carmine, MD   FINE NEEDLE ASPIRATION Right 11/2012   thyroid  nodule - benign follicular cells consistent with colloid nodule ([place])   LAPAROSCOPIC VAGINAL HYSTERECTOMY WITH SALPINGO OOPHORECTOMY Bilateral 01/06/2016   LAPAROSCOPIC ASSISTED VAGINAL HYSTERECTOMY WITH BILATERAL SALPINGO OOPHORECTOMY;  Archie Savers, MD   nsvd     x2   tear duct surgery  06/1998   Right   thyroid  uptake scan  2007   early hyperthyroidism, suggestive early toxic adenomas of left lobe ([place])   thyroid  US   2007   benign cyst - R lobe 3mm, 1.1cm mid post left lobe, 1.2 cm mid left lobe   TONSILLECTOMY      FAMILY HISTORY:  We obtained a detailed, 4-generation family history.  Significant diagnoses are listed below: Family History  Problem Relation Age of Onset   Breast cancer Mother 79       PMS2+   Hypertension Mother    Stroke Mother 15   Esophageal cancer Father    Other Sister        PMS2+  Breast cancer Maternal Aunt 1 - 79   Breast cancer Maternal Aunt    Lung cancer Maternal Uncle    Prostate cancer Maternal Uncle    Breast cancer Cousin        3 mat cousins, one dx 67s   Stroke Other        GM     Karen Rodriguez has 1 sister, 20, who had genetic testing and was found to have a PMS2 mutation. She has 1 son, 39 and 1 daughter, 100..    [name] mother had breast cancer at 67 and recently had genetic testing that showed a PMS2 mutation (Lynch syndrome). Patient has 3 maternal uncles and 5 maternal aunts. Another aunt had breast cancer at 45 and tested positive for PMS2 mutation. Another aunt had leukemia. An uncle had prostate cancer. Another uncle had lung cancer at 74, his  daughter died of breast cancer at 13. Karen Rodriguez has 2 other maternal cousins that had breast cancer, one in her 15s and one in her 53s. Another cousin may have had uterine cancer.    Karen Rodriguez father passed at 31, he had COPD and esophageal cancer. He was an only child. His parents passed due to COPD. Patient's paternal great aunt's daughter had kidney cancer, and great aunt's son had esophageal cancer.   Karen Rodriguez is aware of previous family history of genetic testing for hereditary cancer risks. There is no reported Ashkenazi Jewish ancestry. There is no known consanguinity.    GENETIC COUNSELING ASSESSMENT: Karen Rodriguez is a 58 y.o. female with a family history of a PMS2 mutation (Lynch syndrome).  We, therefore, discussed and recommended the following at today's visit.   DISCUSSION: We discussed that approximately 10% of cancer is hereditary. We discussed the PMS2 gene in detail, noting that it increases risk for colon and uterine cancer. She has a 50% chance to have inherited this from her mother. There are other genes associated with cancer as well.  Cancers and risks are gene specific. We discussed that testing is beneficial for several reasons including knowing about cancer risks, identifying potential screening and risk-reduction options that may be appropriate, and to understand if other family members could be at risk for cancer and allow them to undergo genetic testing.   We reviewed the characteristics, features and inheritance patterns of hereditary cancer syndromes. We also discussed genetic testing, including the appropriate family members to test, the process of testing, insurance coverage and turn-around-time for results. We discussed the implications of a negative, positive and/or variant of uncertain significant result. We recommended Karen Rodriguez pursue genetic testing for the [facility] CancerNext+RNA gene panel.   The [facility] CancerNext+RNAinsight Panel includes sequencing,  rearrangement analysis, and RNA analysis for the following 40 genes: APC, ATM, BAP1, BARD1, BMPR1A, BRCA1, BRCA2, BRIP1, CDH1, CDKN2A, CHEK2, FH, FLCN, MET, MLH1, MSH2, MSH6, MUTYH, NF1, NTHL1, PALB2, PMS2, PTEN, RAD51C, RAD51D, RPS20, SMAD4, STK11, TP53, TSC1, TSC2, and VHL (sequencing and deletion/duplication); AXIN2, HOXB13, MBD4, MSH3, POLD1 and POLE (sequencing only); EPCAM and GREM1 (deletion/duplication only).  Based on Ms. Ruddy's family history of PMS2 mutation she meets medical criteria for genetic testing. Despite that she meets criteria, she may still have an out of pocket cost.   PLAN: After considering the risks, benefits, and limitations, Ms. Laidlaw provided informed consent to pursue genetic testing and the blood sample was sent to [facility] for analysis of the CancerNext+RNA Panel. Results should be available within approximately 2-3 weeks' time, at which point  they will be disclosed by telephone to Ms. Christen, as will any additional recommendations warranted by these results. Ms. Heidt will receive a summary of her genetic counseling visit and a copy of her results once available. This information will also be available in Epic.   Ms. Brouhard's questions were answered to her satisfaction today. Our contact information was provided should additional questions or concerns arise. Thank you for the referral and allowing us  to share in the care of your patient.   Dena Cary, MS, [facility] Genetic Counselor [email or web address] Phone: [phone]  45 minutes were spent on the date of the encounter in service to the patient including preparation, face-to-face consultation, documentation and care coordination. Dr. Delinda was available for discussion regarding this case.   _______________________________________________________________________ For Office Staff:  Number of people involved in session: 1 Was an Intern/ student involved with case: no

## 2023-12-22 NOTE — Telephone Encounter (Signed)
 error

## 2024-01-12 ENCOUNTER — Ambulatory Visit: Payer: Self-pay | Admitting: Licensed Clinical Social Worker

## 2024-01-12 ENCOUNTER — Encounter: Payer: Self-pay | Admitting: Licensed Clinical Social Worker

## 2024-01-12 ENCOUNTER — Telehealth: Payer: Self-pay | Admitting: Licensed Clinical Social Worker

## 2024-01-12 DIAGNOSIS — Z1509 Genetic susceptibility to other malignant neoplasm: Secondary | ICD-10-CM | POA: Insufficient documentation

## 2024-01-12 DIAGNOSIS — Z1379 Encounter for other screening for genetic and chromosomal anomalies: Secondary | ICD-10-CM

## 2024-01-12 NOTE — Progress Notes (Signed)
 Genetic Test Results - PMS2+  HPI:   Karen Rodriguez was previously seen in the [facility] due to a family history of Lynch syndrome and concerns regarding a hereditary predisposition to cancer. Please refer to our prior cancer genetics clinic note for more information regarding our discussion, assessment and recommendations, at the time. Karen Rodriguez recent genetic test results were disclosed to her, as were recommendations warranted by these results. These results and recommendations are discussed in more detail below.  CANCER HISTORY:  Oncology History   No history exists.    FAMILY HISTORY:  We obtained a detailed, 4-generation family history.  Significant diagnoses are listed below: Family History  Problem Relation Age of Onset   Breast cancer Mother 76       PMS2+   Hypertension Mother    Stroke Mother 70   Esophageal cancer Father    Other Sister        PMS2+   Breast cancer Maternal Aunt 50 - 79   Breast cancer Maternal Aunt    Lung cancer Maternal Uncle    Prostate cancer Maternal Uncle    Breast cancer Cousin        3 mat cousins, one dx 82s   Stroke Other        GM    Karen Rodriguez has 1 sister, 58, who had genetic testing and was found to have a PMS2 mutation. She has 1 son, 59 and 1 daughter, 63..    Karen Rodriguez mother had breast cancer at 15 and recently had genetic testing that showed a PMS2 mutation (Lynch syndrome). Patient has 3 maternal uncles and 5 maternal aunts. Another aunt had breast cancer at 35 and tested positive for PMS2 mutation. Another aunt had leukemia. An uncle had prostate cancer. Another uncle had lung cancer at 63, his daughter died of breast cancer at 2. [name] has 2 other maternal cousins that had breast cancer, one in her 75s and one in her 24s. Another cousin may have had uterine cancer.    [name] father passed at 5, he had COPD and esophageal cancer. He was an only child. His parents passed due to  COPD. Patient's paternal great aunt's daughter had kidney cancer, and great aunt's son had esophageal cancer.   [name] is aware of previous family history of genetic testing for hereditary cancer risks. There is no reported Ashkenazi Jewish ancestry. There is no known consanguinity.     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 Specifically, this variant is c.[id number] (p.L625*). The remainder of testing on the [facility]RNA panel was normal.  The [facility]RNAinsight Panel includes sequencing, rearrangement analysis, and RNA analysis for the following 40 genes: APC, ATM, BAP1, BARD1, BMPR1A, BRCA1, BRCA2, BRIP1, CDH1, CDKN2A, CHEK2, FH, FLCN, MET, MLH1, MSH2, MSH6, MUTYH, NF1, NTHL1, PALB2, PMS2, PTEN, RAD51C, RAD51D, RPS20, SMAD4, STK11, TP53, TSC1, TSC2, and VHL (sequencing and deletion/duplication); AXIN2, HOXB13, MBD4, MSH3, POLD1 and POLE (sequencing only); EPCAM and GREM1 (deletion/duplication only).  The test report has been scanned into EPIC and is located under the Molecular Pathology section of the Results Review tab.  A portion of the result report is included below for reference. Genetic testing reported out on 01/10/2024.      This result does not explain the family history of breast cancer. Based on the reported personal and family history, specific cancer screenings for Ms. Arland RAMAN  Rodriguez and her family include:  Breast Cancer Screening:  The Tyrer-Cuzick model is one of multiple prediction models developed to estimate an individual's lifetime risk of developing breast cancer. The Tyrer-Cuzick model is endorsed by the [employer] (NCCN). This model includes many risk factors such as family history, endogenous estrogen exposure, and benign breast disease. The calculation is highly-dependent on the accuracy of clinical data provided by the patient and can  change over time. The Tyrer-Cuzick model may be repeated to reflect new information in her personal or family history in the future.   Karen Rodriguez'sTyrer-Cuzick risk score is 17%.  This is above the general population risk of 13%, therefore, she is encouraged to continue to be mindful of her family history and be diligent with general population breast screening, including annual mammograms beginning 10 years prior to the youngest diagnosis in her family or by age 61.  She is encouraged to contact us  regarding any changes to her personal or family history, as her recommendations for screening would be altered significantly if her lifetime risk is determined to be greater than 20% based on updated information.      PMS2 Clinical Information Pathogenic variants in the PMS2 gene are associated with Lynch syndrome.  The cancers associated with PMS2 are: Colorectal cancer, 8.7-20% risk (average age of diagnosis is 4-66) Endometrial cancer, 13-26% risk (average age of diagnosis is 30-50) While other LS-associated cancers have been observed in individuals with LS, it is unclear whether PMS2 LS carriers have increased risk for these cancers compared to the general population. Accordingly, data are insufficient to provide cancer risk estimates or surveillance/risk reduction recommendations beyond those for colon and endometrial cancer. Surveillance for cancers other than colon or endometrial among PMS2 LS carriers should be individualized based on personal and family cancer history and clinical judgement.   Management Recommendations:  Colorectal Cancer Screening: High quality colonoscopy at age 21-35 or 2-5 years prior to the earliest colon cancer if it is diagnosed before age 93 and repeat every 1-3 years Studies have demonstrated that the use of daily aspirin decreases the colorectal cancer risk in patients with Lynch Syndrome. Ongoing studies are investigating the optimal dose and duration of use of  aspirin for colon cancer prevention in Lynch Syndrome. The decision to use aspirin should be made on an individualized basis including a discussion of dose, benefits, and adverse effects.    Endometrial Cancer Screening/Risk Reduction: Women should report any abnormal uterine bleeding or postmenopausal bleeding. The evaluation of these symptoms should include an endometrial biopsy. A hysterectomy may be considered. The timing should be individualized based on whether childbearing is complete, comorbidities, and family history. Endometrial cancer screening does not have a proven benefit in women with Lynch Syndrome. However, endometrial biopsy is highly sensitive and specific as a diagnostic procedure. Screening via endometrial biopsy every 1-2 years starting at age 81-35 can be considered. Transvaginal ultrasounds may be considered in postmenopausal women at their clinician's discretion. PMS2 carriers appear to be at only a modestly increased risk of endometrial cancer in contrast to MLH1, MSH2 and MSH6 carriers.   Additional Considerations: Patients of reproductive age should be made aware of options for prenatal diagnosis and assisted reproduction including pre-implantation genetic diagnosis. Individuals with a single pathogenic PMS2 variant are also carriers of constitutional MMR deficiency (CMMRD) syndrome. CMMRD is a childhood-onset cancer predisposition syndrome that can present with hematological malignancies, cancers of the brain and central nervous system, Lynch syndrome-associated cancers (colon, uterine, small bowel,  urinary tract), embryonic tumors, and sarcomas. Some affected individuals may also display cafe-au-lait macules. For there to be a risk of CMMRD in offspring, an individual and their partner would each have to have a single pathogenic variant in the same MMR gene; in such a case, the risk of having an affected child is 25%.   This information is based on current understanding of  the gene and may change in the future.  Implications for Family Members: Hereditary predisposition to cancer due to pathogenic variants in the PMS2 gene has autosomal dominant inheritance. This means that an individual with a pathogenic variant has a 50% chance of passing the condition on to his/her offspring. Most cases are inherited from a parent, but some cases may occur spontaneously (i.e., an individual with a pathogenic variant has parents who do not have it). Identification of a pathogenic variant allows for the recognition of at-risk relatives who can pursue testing for the familial variant.  Family members are encouraged to consider genetic testing for this familial pathogenic variant. As there are generally no childhood cancer risks associated with pathogenic variants in the PSM2 gene, individuals in the family are not recommended to have testing until they reach at least 58 years of age. They may contact our office at [phone] for more information or to schedule an appointment.  Complimentary testing for the familial variant is available for 90 days from the report date. Family members who live outside of the area are encouraged to find a genetic counselor in their area by visiting: [email or web address].  Resources: [employer] (Facing Our Risk of Cancer Empowered) is a resource for those with a hereditary predisposition to develop cancer.  [employer] provides information about risk reduction, advocacy, legislation, and clinical trials.  Additionally, [employer] provides a platform for collaboration and support which includes: peer navigation, message boards, local support groups, a toll-free helpline, research registry and recruitment, advocate training, published medical research, webinars, brochures, mastectomy photos, and more.  For more information, visit www.facingourrisk.org  Hereditary Colon Cancer Foundation- www.hcctakesguts.org  Lynch Syndrome [employer]-  www.lynchcancers.com  [employer]- www.kintalk.org  PLAN:   1. These results will be made available to her PCP, Dr. Sherial, her GYN provider Dr. Lovetta, and she would like to be referred to Dr. Onita for GI follow up. She would like these providers to follow her long-term for this indication.   2. Ms. Harsha plans to discuss these results with her family and will reach out to us  if we can be of any assistance in coordinating genetic testing for any of her relatives.    We encouraged Ms. Rocque to remain in contact with us  on an annual basis so we can update her personal and family histories, and let her know of advances in cancer genetics that may benefit the family. Our contact number was provided. Ms. Oravec questions were answered to her satisfaction today, and she knows she is welcome to call anytime with additional questions.   Dena Cary, MS, [facility] Genetic Counselor [email or web address] Phone: [phone]

## 2024-01-12 NOTE — Telephone Encounter (Signed)
 I contacted Ms. Wyka to discuss her genetic testing results. Pathogenic variant in PMS2 called c.[id number] identified. Detailed clinic note to follow.   The test report has been scanned into EPIC and is located under the Molecular Pathology section of the Results Review tab.  A portion of the result report is included below for reference.      Dena Cary, MS, [facility] Genetic Counselor [email or web address] Phone: [phone]

## 2024-01-29 IMAGING — MG MM DIGITAL DIAGNOSTIC UNILAT*L* IMPLANT W/ TOMO W/ CAD
6 series · 6 of 18 positions shown · non-contrast
Comparison: Previous exam(s).

CLINICAL DATA: Patient was recalled from screening mammogram for a
possible mass in the left breast.

EXAM:
DIGITAL DIAGNOSTIC UNILATERAL LEFT MAMMOGRAM WITH IMPLANTS, CAD AND
TOMOSYNTHESIS; ULTRASOUND LEFT BREAST LIMITED
TECHNIQUE: Left digital diagnostic mammography and breast tomosynthesis was
performed. The images were evaluated with computer-aided detection.
Standard and/or implant displaced views were performed.; Targeted
ultrasound examination of the left breast was performed.

[L MLO synth-2D]
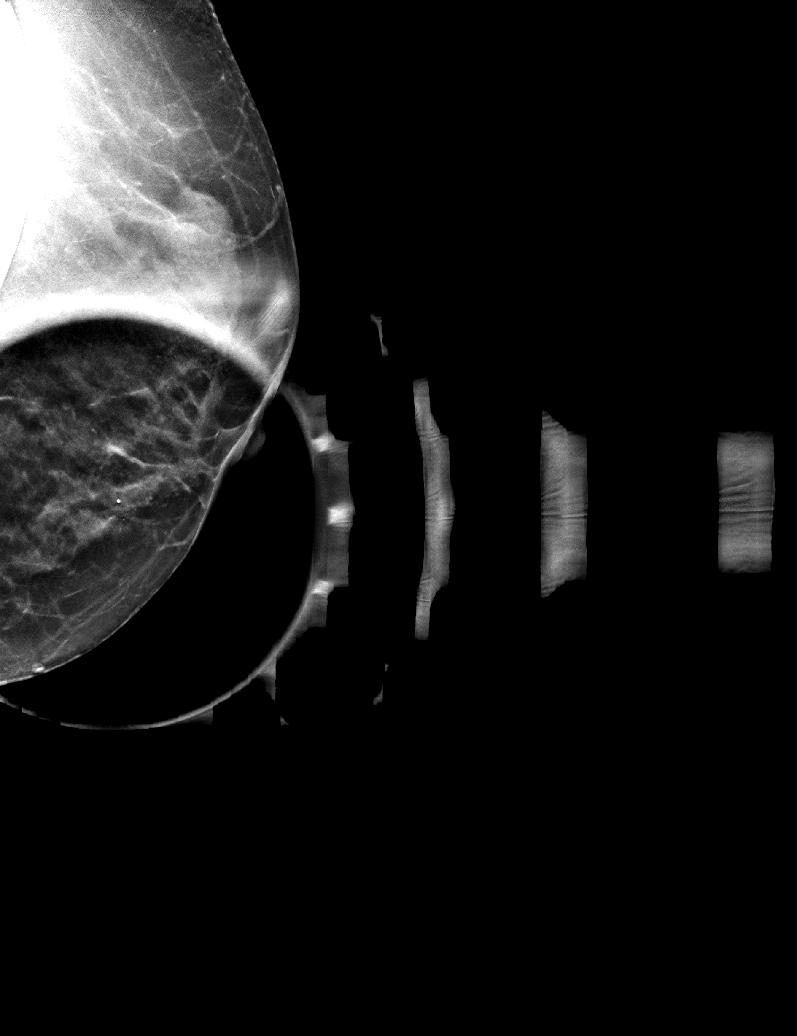

[L CC synth-2D (1 of 2)]
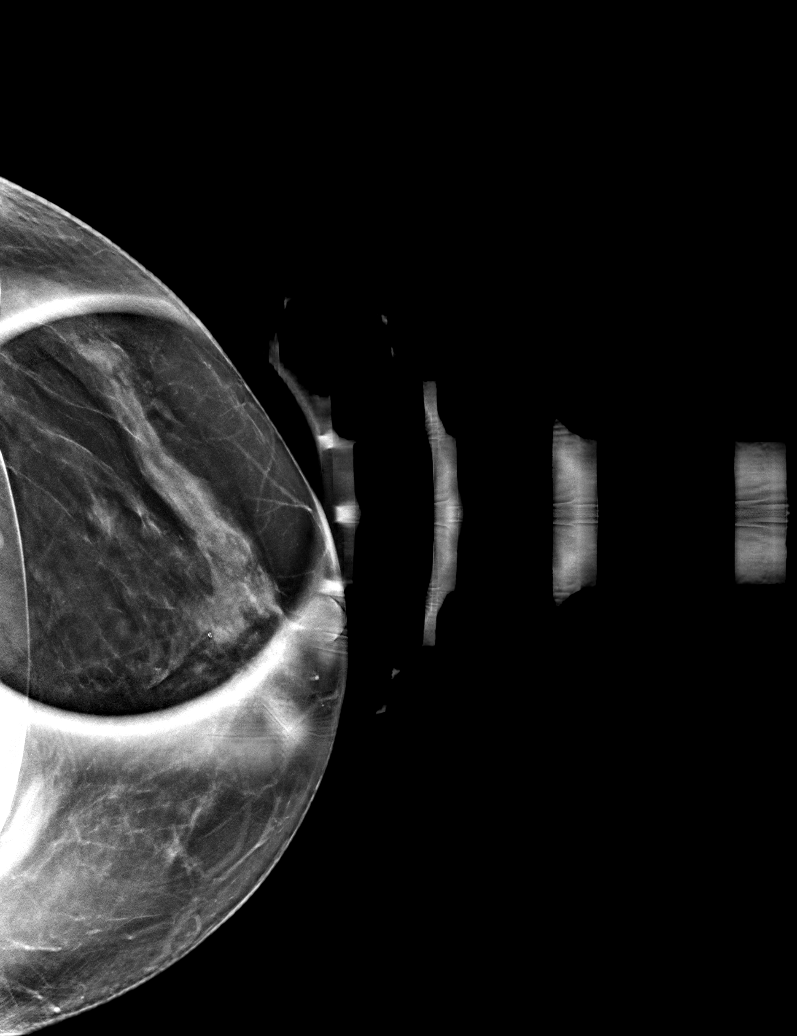

[L CC synth-2D (2 of 2)]
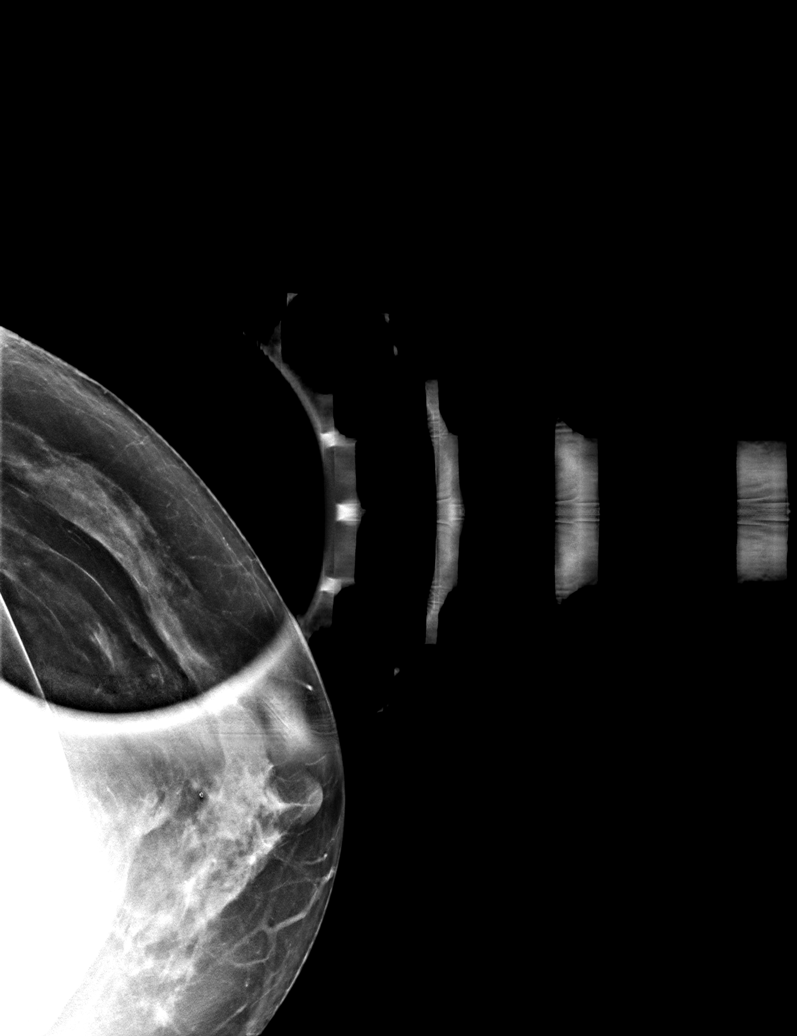

[L MLOID BREAST TOMOSYNTHESIS IMAGE tomo · tomo slice 21/42.0]
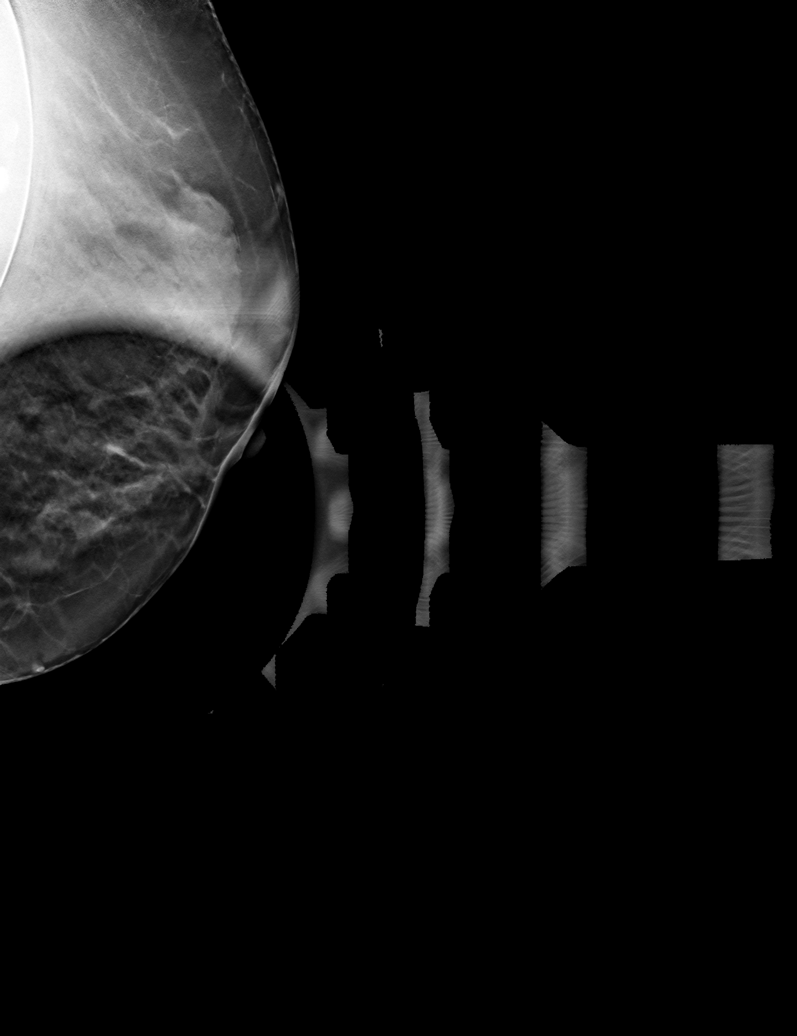

[L CCID BREAST TOMOSYNTHESIS IMAGE tomo (1 of 2) · tomo slice 27/54.0]
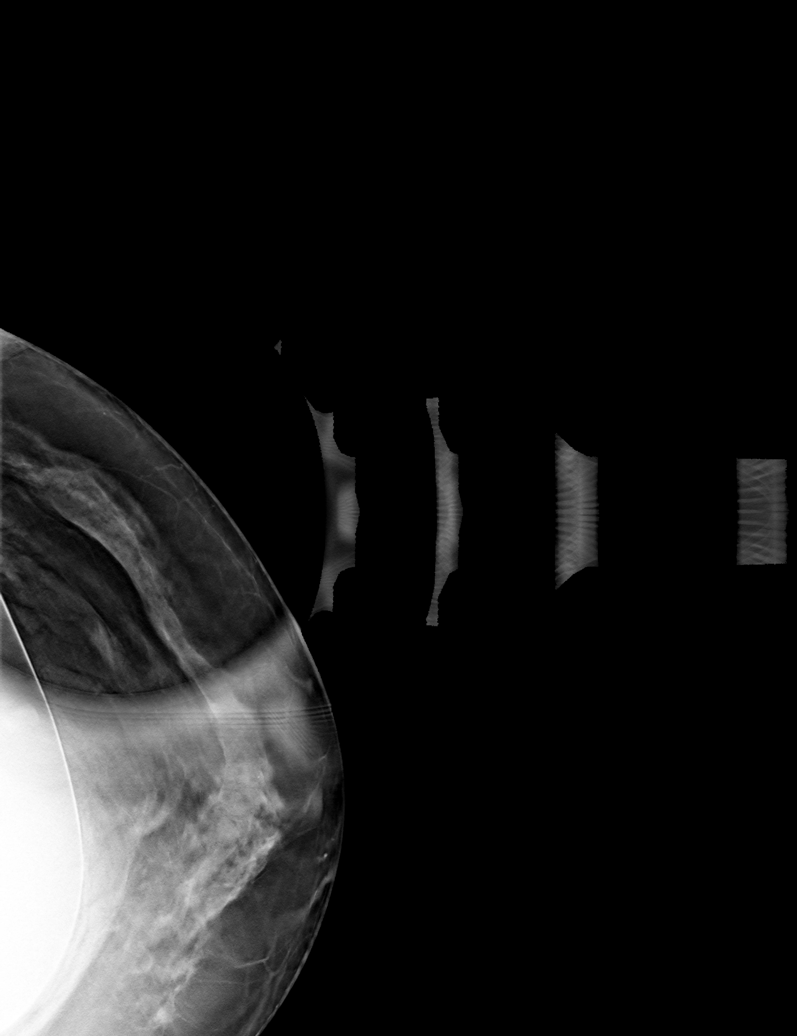

[L CCID BREAST TOMOSYNTHESIS IMAGE tomo (2 of 2) · tomo slice 27/54.0]
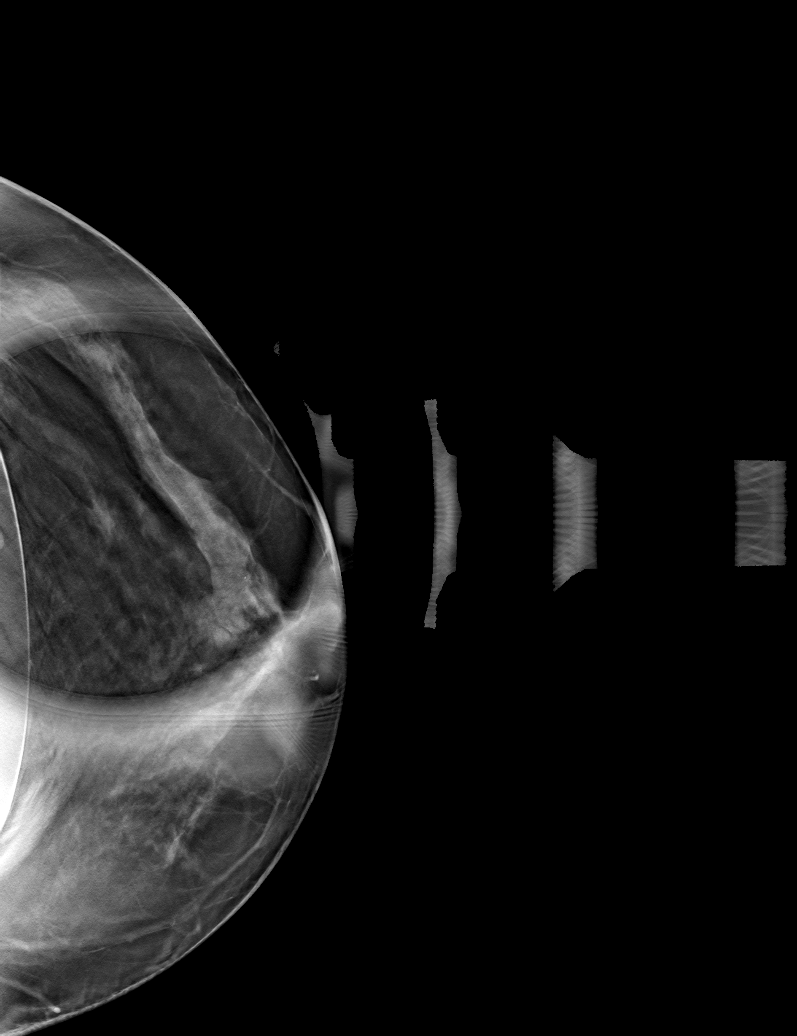

[6 of 18 positions shown; findings below may reference images not displayed]

ACR Breast Density Category c: The breast tissue is heterogeneously
dense, which may obscure small masses.
FINDINGS: Additional imaging of the left breast was performed. There are 2
well-circumscribed masses in the lower outer quadrant of the breast.
There are no malignant type microcalcifications. The patient has
retropectoral implants.

Targeted ultrasound is performed, showing an anechoic cyst in the
left breast at 5 o'clock 3 cm from the nipple measuring 4 x 4 x 4
mm. There is a near anechoic cyst in the left breast at 4 o'clock 1
cm from the nipple measuring 4 x 3 x 4 mm. No solid mass identified.
IMPRESSION: Left breast cysts.  No evidence of malignancy in the left breast.

RECOMMENDATION:
Bilateral screening mammogram in Saturday June, 2022 is recommended.

I have discussed the findings and recommendations with the patient.
If applicable, a reminder letter will be sent to the patient
regarding the next appointment.

BI-RADS CATEGORY  2: Benign.

## 2024-01-29 IMAGING — US US BREAST*L* LIMITED INC AXILLA
1 series · 9 of 9 positions shown · non-contrast
Comparison: Previous exam(s).

CLINICAL DATA: Patient was recalled from screening mammogram for a
possible mass in the left breast.

EXAM:
DIGITAL DIAGNOSTIC UNILATERAL LEFT MAMMOGRAM WITH IMPLANTS, CAD AND
TOMOSYNTHESIS; ULTRASOUND LEFT BREAST LIMITED
TECHNIQUE: Left digital diagnostic mammography and breast tomosynthesis was
performed. The images were evaluated with computer-aided detection.
Standard and/or implant displaced views were performed.; Targeted
ultrasound examination of the left breast was performed.

[Series 1: us breast*left* limited inc axilla · 0.04mm/px · 9 of 9 slices shown]
[im 1/9]
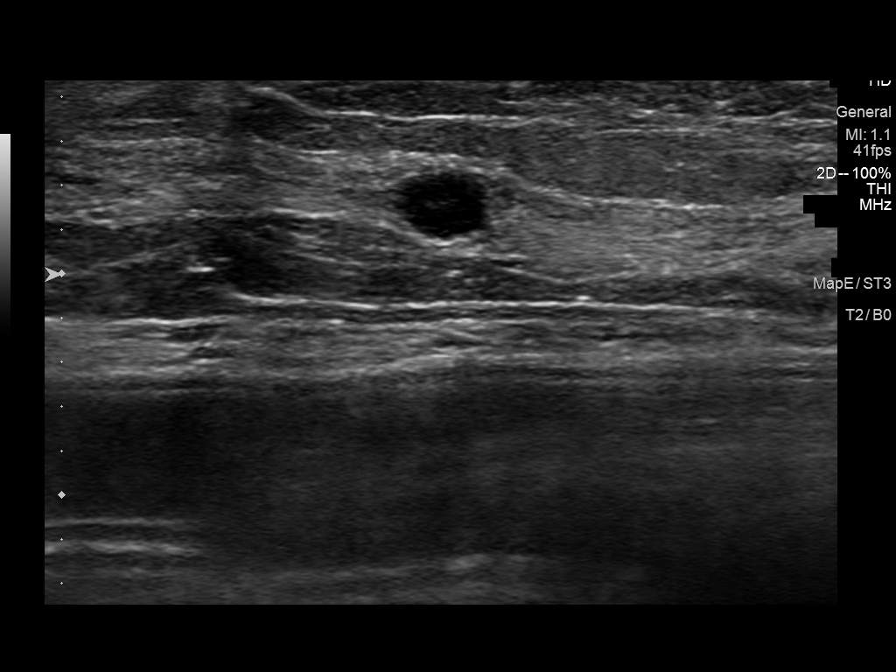
[im 2/9]
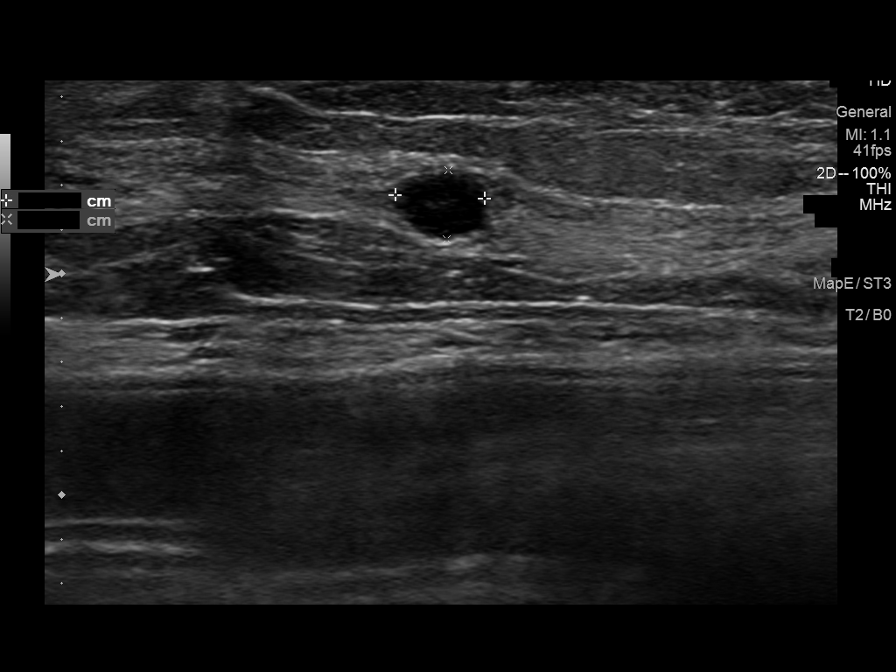
[im 3/9]
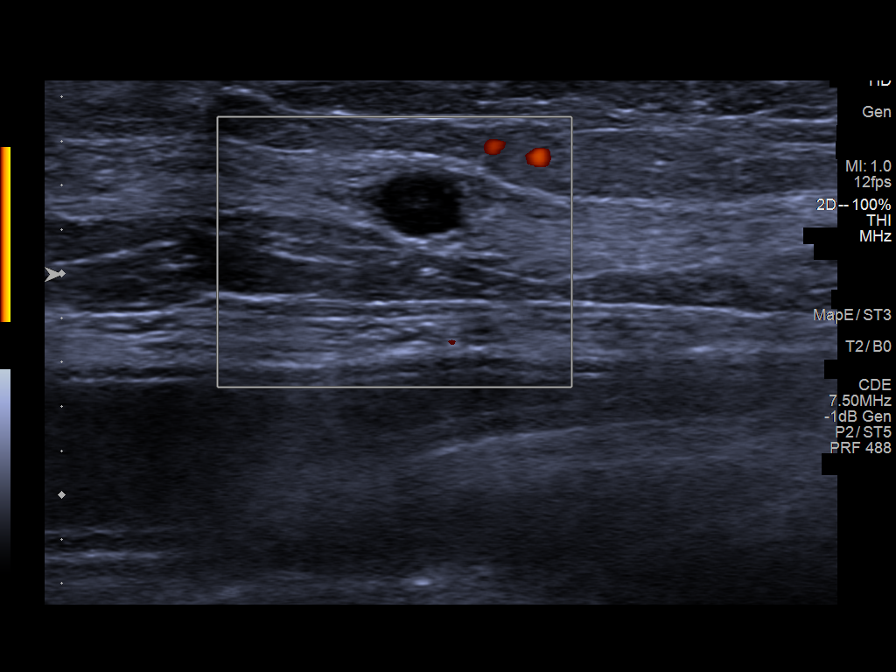
[im 4/9]
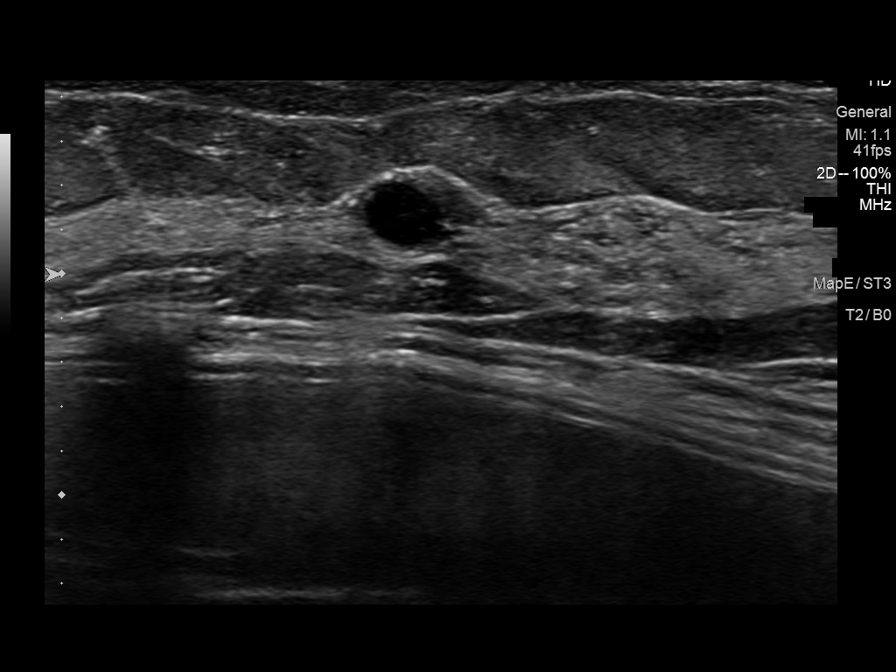
[im 5/9]
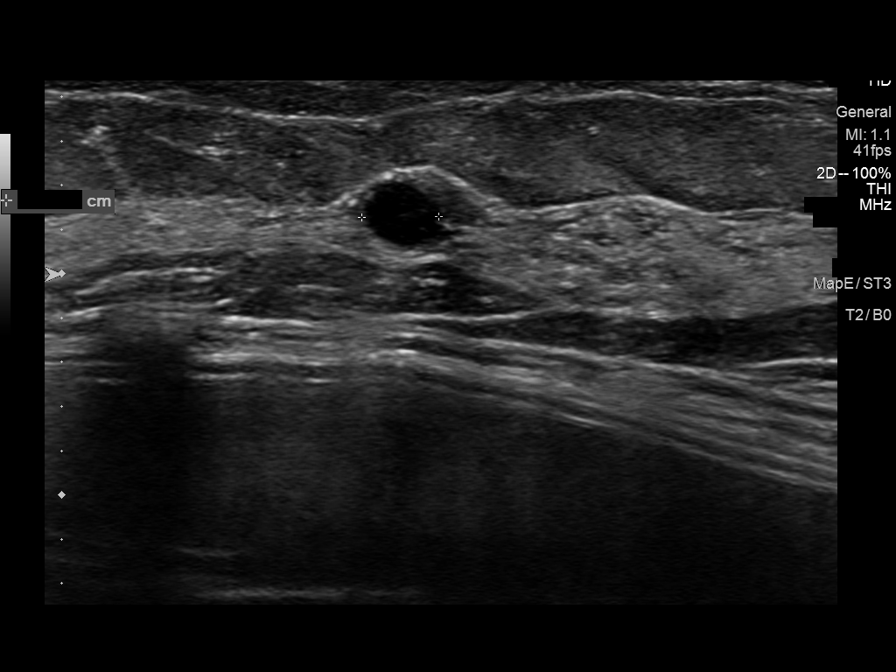
[im 6/9]
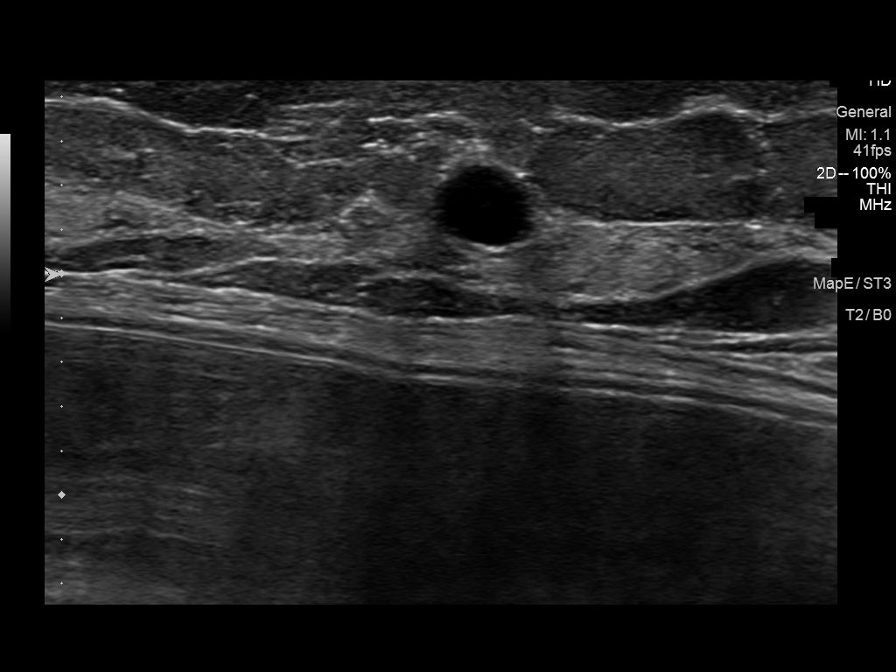
[im 7/9]
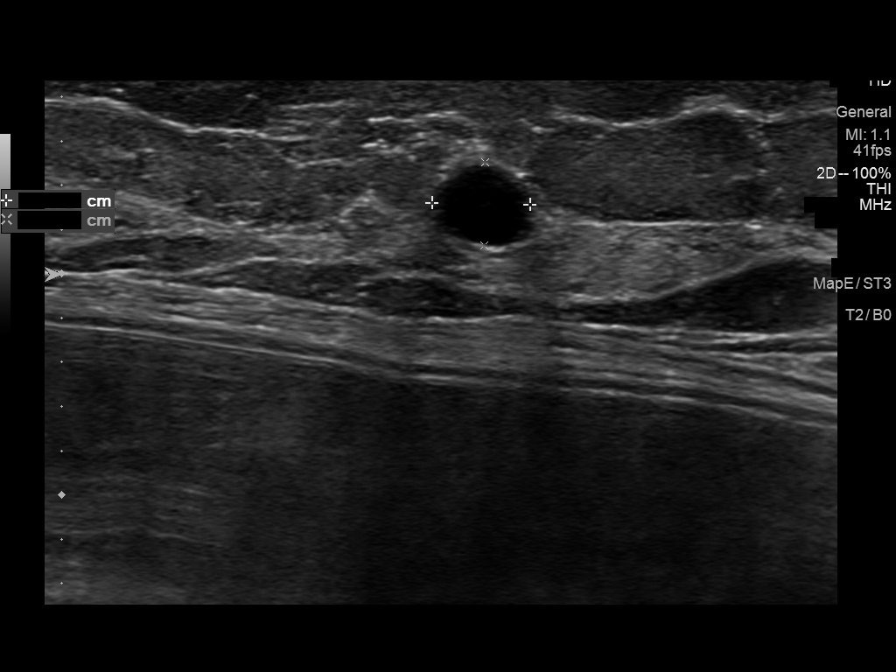
[im 8/9]
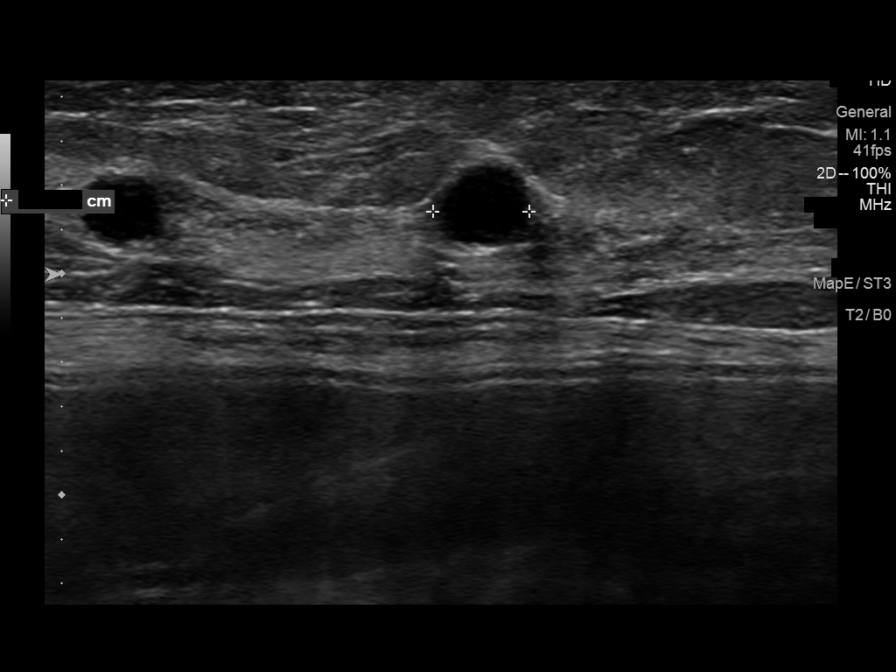
[im 9/9]
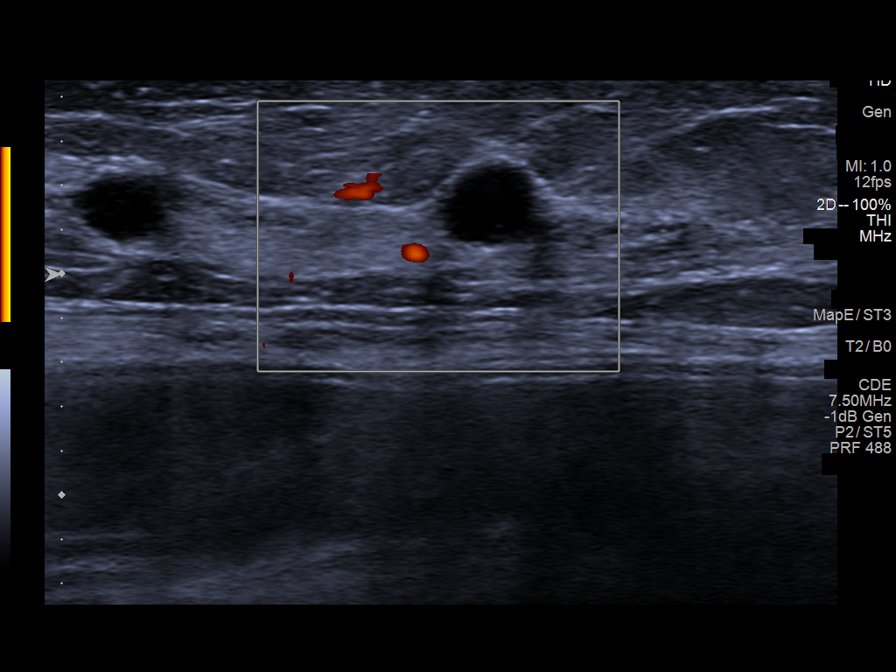

[9 of 9 positions shown; findings below may reference images not displayed]

ACR Breast Density Category c: The breast tissue is heterogeneously
dense, which may obscure small masses.
FINDINGS: Additional imaging of the left breast was performed. There are 2
well-circumscribed masses in the lower outer quadrant of the breast.
There are no malignant type microcalcifications. The patient has
retropectoral implants.

Targeted ultrasound is performed, showing an anechoic cyst in the
left breast at 5 o'clock 3 cm from the nipple measuring 4 x 4 x 4
mm. There is a near anechoic cyst in the left breast at 4 o'clock 1
cm from the nipple measuring 4 x 3 x 4 mm. No solid mass identified.
IMPRESSION: Left breast cysts.  No evidence of malignancy in the left breast.

RECOMMENDATION:
Bilateral screening mammogram in Saturday June, 2022 is recommended.

I have discussed the findings and recommendations with the patient.
If applicable, a reminder letter will be sent to the patient
regarding the next appointment.

BI-RADS CATEGORY  2: Benign.
